# Patient Record
Sex: Female | Born: 1970 | Race: Black or African American | Hispanic: No | Marital: Married | Smoking: Never smoker
Health system: Southern US, Community
[De-identification: ages and names within clinical notes are randomized; demographics above are authoritative.]

## PROBLEM LIST (undated history)

## (undated) DIAGNOSIS — Z87898 Personal history of other specified conditions: Secondary | ICD-10-CM

## (undated) DIAGNOSIS — I340 Nonrheumatic mitral (valve) insufficiency: Secondary | ICD-10-CM

## (undated) DIAGNOSIS — J302 Other seasonal allergic rhinitis: Secondary | ICD-10-CM

## (undated) DIAGNOSIS — Z8249 Family history of ischemic heart disease and other diseases of the circulatory system: Secondary | ICD-10-CM

## (undated) DIAGNOSIS — R079 Chest pain, unspecified: Secondary | ICD-10-CM

## (undated) DIAGNOSIS — R011 Cardiac murmur, unspecified: Secondary | ICD-10-CM

## (undated) DIAGNOSIS — R42 Dizziness and giddiness: Secondary | ICD-10-CM

## (undated) DIAGNOSIS — T783XXA Angioneurotic edema, initial encounter: Secondary | ICD-10-CM

## (undated) DIAGNOSIS — J069 Acute upper respiratory infection, unspecified: Secondary | ICD-10-CM

## (undated) DIAGNOSIS — Z833 Family history of diabetes mellitus: Secondary | ICD-10-CM

## (undated) DIAGNOSIS — T8859XA Other complications of anesthesia, initial encounter: Secondary | ICD-10-CM

## (undated) DIAGNOSIS — J45909 Unspecified asthma, uncomplicated: Secondary | ICD-10-CM

## (undated) HISTORY — DX: Family history of ischemic heart disease and other diseases of the circulatory system: Z82.49

## (undated) HISTORY — DX: Other seasonal allergic rhinitis: J30.2

## (undated) HISTORY — DX: Dizziness and giddiness: R42

## (undated) HISTORY — DX: Chest pain, unspecified: R07.9

## (undated) HISTORY — PX: WISDOM TOOTH EXTRACTION: SHX21

## (undated) HISTORY — DX: Cardiac murmur, unspecified: R01.1

## (undated) HISTORY — DX: Acute upper respiratory infection, unspecified: J06.9

## (undated) HISTORY — DX: Unspecified asthma, uncomplicated: J45.909

## (undated) HISTORY — DX: Angioneurotic edema, initial encounter: T78.3XXA

## (undated) HISTORY — DX: Personal history of other specified conditions: Z87.898

## (undated) HISTORY — DX: Family history of diabetes mellitus: Z83.3

---

## 1999-05-23 HISTORY — PX: OTHER SURGICAL HISTORY: SHX169

## 2001-09-18 ENCOUNTER — Encounter: Payer: Self-pay | Admitting: Family Medicine

## 2001-09-18 ENCOUNTER — Ambulatory Visit (HOSPITAL_COMMUNITY): Admission: RE | Admit: 2001-09-18 | Discharge: 2001-09-18 | Payer: Self-pay | Admitting: Family Medicine

## 2001-10-15 ENCOUNTER — Encounter: Payer: Self-pay | Admitting: Family Medicine

## 2001-10-15 ENCOUNTER — Encounter (HOSPITAL_COMMUNITY): Admission: RE | Admit: 2001-10-15 | Discharge: 2001-11-14 | Payer: Self-pay | Admitting: Family Medicine

## 2002-02-06 ENCOUNTER — Ambulatory Visit (HOSPITAL_COMMUNITY): Admission: RE | Admit: 2002-02-06 | Discharge: 2002-02-06 | Payer: Self-pay | Admitting: Family Medicine

## 2002-02-06 ENCOUNTER — Encounter: Payer: Self-pay | Admitting: Family Medicine

## 2005-04-20 ENCOUNTER — Ambulatory Visit: Payer: Self-pay | Admitting: Family Medicine

## 2005-10-22 HISTORY — PX: TUBAL LIGATION: SHX77

## 2007-01-23 ENCOUNTER — Encounter: Payer: Self-pay | Admitting: Family Medicine

## 2009-05-31 ENCOUNTER — Ambulatory Visit (HOSPITAL_COMMUNITY): Admission: RE | Admit: 2009-05-31 | Discharge: 2009-05-31 | Payer: Self-pay | Admitting: Cardiology

## 2009-05-31 ENCOUNTER — Encounter (INDEPENDENT_AMBULATORY_CARE_PROVIDER_SITE_OTHER): Payer: Self-pay | Admitting: Cardiology

## 2009-05-31 HISTORY — PX: DOBUTAMINE STRESS ECHO: SHX5426

## 2010-02-21 NOTE — Letter (Signed)
Summary: rpc chart  rpc chart   Imported By: Curtis Sites 08/16/2009 11:48:57  _____________________________________________________________________  External Attachment:    Type:   Image     Comment:   External Document

## 2011-07-23 HISTORY — PX: TRANSTHORACIC ECHOCARDIOGRAM: SHX275

## 2013-04-12 ENCOUNTER — Encounter: Payer: Self-pay | Admitting: *Deleted

## 2013-04-14 ENCOUNTER — Ambulatory Visit (INDEPENDENT_AMBULATORY_CARE_PROVIDER_SITE_OTHER): Payer: Managed Care, Other (non HMO) | Admitting: Internal Medicine

## 2013-04-14 ENCOUNTER — Encounter: Payer: Self-pay | Admitting: Internal Medicine

## 2013-04-14 VITALS — BP 112/88 | HR 77 | Ht 63.0 in | Wt 205.6 lb

## 2013-04-14 DIAGNOSIS — R0789 Other chest pain: Secondary | ICD-10-CM

## 2013-04-14 DIAGNOSIS — R609 Edema, unspecified: Secondary | ICD-10-CM | POA: Insufficient documentation

## 2013-04-14 DIAGNOSIS — G629 Polyneuropathy, unspecified: Secondary | ICD-10-CM | POA: Insufficient documentation

## 2013-04-14 DIAGNOSIS — G589 Mononeuropathy, unspecified: Secondary | ICD-10-CM

## 2013-04-14 DIAGNOSIS — R600 Localized edema: Secondary | ICD-10-CM

## 2013-04-14 NOTE — Progress Notes (Signed)
OFFICE NOTE  Chief Complaint:  Arm tingling, swelling  Primary Care Physician: Maggie Font, MD  HPI:  Hailey Lam is a 43 year old female with history of chest pain in the past which was somewhat atypical and a low-risk stress echo. I saw her last in 2013, when she had some more mild shortness of breath, left-sided chest tingling. Her gynecologist thought she heard a heart murmur and she was subsequently referred for an echo. The echo does show mild mitral regurgitation and anterior prolapse of mitral leaflet suggesting mild mitral valve prolapse with a hyperdynamic EF at greater than 65%. Both of these could certainly be responsible for her murmur and the mitral valve prolapse would certainly fit her syndrome of anxiety as well as some palpitations. Her chest pain has been quite atypical and I felt did not require any further cardiac workup. Some of the symptoms may be coming from her neck and back, which she says when she is under stress the symptoms are worse, and she may want to seek out an orthopedic or spine surgeon.   Today she is reporting similar symptoms including twitching in the left upper chest and tingling down into the left arm. Chest is tingling and numbness in her fingers of both hands and some upper extremity swelling as well as lower extremity swelling. There does appear to be dependent edema as she does develop swelling in her legs which is worse as the day goes on and goes away in the morning.  She does report a history of sciatica mostly on the right and is seen a chiropractor about this but has never had any imaging of her lumbar cervical spine. She did have x-rays apparently of her right hip which showed some arthritis.  PMHx:  Past Medical History  Diagnosis Date  . Chest pain   . History of palpitations   . Seasonal allergies   . Dizziness   . Family history of hypertension   . Family history of diabetes mellitus     Past Surgical History  Procedure  Laterality Date  . Tubal ligation  10/2005  . Abdominal ablation  05/1999  . Dobutamine stress echo  05/31/2009    exercised to stage 3 of bruce protocol - normal BP response, no ischemia by EKG & echo criteria  . Transthoracic echocardiogram  07/2011    EF=>55%, LV hyperdynamic; MV leaflets appear thickened & mild MR; mild TR    FAMHx:  Family History  Problem Relation Age of Onset  . Pneumonia Maternal Grandmother     SOCHx:   reports that she has never smoked. She has never used smokeless tobacco. Her alcohol and drug histories are not on file.  ALLERGIES:  Allergies  Allergen Reactions  . Minocycline Swelling  . Sulfa Antibiotics     ROS: A comprehensive review of systems was negative except for: Cardiovascular: positive for lower extremity edema and and upper extremity edema Neurological: positive for neuropathy, low back and neck pain, parasthesias  HOME MEDS: Current Outpatient Prescriptions  Medication Sig Dispense Refill  . Cetirizine HCl (ZYRTEC PO) Take by mouth as needed.      . Chlorphen-Phenyltolox-PE-APAP (NOREL SR PO) Take by mouth as needed.       No current facility-administered medications for this visit.    LABS/IMAGING: No results found for this or any previous visit (from the past 48 hour(s)). No results found.  VITALS: BP 112/88  Pulse 77  Ht 5\' 3"  (1.6 m)  Wt 205  lb 9.6 oz (93.26 kg)  BMI 36.43 kg/m2  EXAM: General appearance: alert and no distress Neck: no carotid bruit and no JVD Lungs: clear to auscultation bilaterally Heart: regular rate and rhythm, S1, S2 normal and systolic murmur: early systolic 2/6, blowing at apex Abdomen: soft, non-tender; bowel sounds normal; no masses,  no organomegaly Extremities: edema 1+ pitting edema Pulses: 2+ and symmetric Skin: Skin color, texture, turgor normal. No rashes or lesions Neurologic: Mental status: Alert, oriented, thought content appropriate, normal sensation to light touch in the  extremities, +Phalen's and tinel's signs in the wrists Psych: Normal  EKG: Sinus rhythm at 77  ASSESSMENT: 1. Neuropathic pain, possibly from the neck and back 2. Upper and lower extremity edema possibly secondary to neuropathy or angioedema  PLAN: 1.   Hailey Lam is not describing any cardiac symptoms. Her upper extremity numbness and tingling is somewhat worsened in the fingers with positive Phalen's and Tinel's signs, suggesting this could be a carpal tunnel syndrome or perhaps a cervical radiculopathy. She has had problems with neck pain in the past and this could be radiating to her left upper chest. In addition she has problems with low back pain and has seen a chiropractor about right hip pain and sciatica. There are no lower extermity varicose veins or signs of venous insufficiency. Suspect her edema may be due to some vascular leak. I recommended salt avoidance and thought she would benefit from compression stockings. She could consider low-dose diuretic which she's taken in the past however she had problems with the urination related to that.  I strongly recommended that she be evaluated by an orthopedic spine surgeon or neurosurgeon, again, for further work up of her symptoms.  Pixie Casino, MD, [facility] Attending Cardiologist [facility]  Hailey Lam 04/14/2013, 12:47 PM

## 2013-04-14 NOTE — Patient Instructions (Signed)
Dr. Debara Pickett has ordered compression stocking. You can purchase them at a medical supply store.   Please follow up with Dr. Debara Pickett as needed.

## 2013-08-07 ENCOUNTER — Other Ambulatory Visit: Payer: Self-pay | Admitting: Obstetrics and Gynecology

## 2013-08-19 ENCOUNTER — Encounter: Payer: Self-pay | Admitting: Obstetrics and Gynecology

## 2013-08-19 ENCOUNTER — Other Ambulatory Visit (HOSPITAL_COMMUNITY)
Admission: RE | Admit: 2013-08-19 | Discharge: 2013-08-19 | Disposition: A | Discharge status: Home / Self Care | Payer: Managed Care, Other (non HMO) | Source: Ambulatory Visit | Attending: Obstetrics and Gynecology | Admitting: Obstetrics and Gynecology

## 2013-08-19 ENCOUNTER — Ambulatory Visit (INDEPENDENT_AMBULATORY_CARE_PROVIDER_SITE_OTHER): Payer: Managed Care, Other (non HMO) | Admitting: Obstetrics and Gynecology

## 2013-08-19 VITALS — BP 132/76 | Ht 63.5 in | Wt 208.0 lb

## 2013-08-19 DIAGNOSIS — Z01419 Encounter for gynecological examination (general) (routine) without abnormal findings: Secondary | ICD-10-CM

## 2013-08-19 DIAGNOSIS — Z1151 Encounter for screening for human papillomavirus (HPV): Secondary | ICD-10-CM | POA: Insufficient documentation

## 2013-08-19 NOTE — Addendum Note (Signed)
Addended by: Farley Ly on: 08/19/2013 03:09 PM   Modules accepted: Orders

## 2013-08-19 NOTE — Progress Notes (Signed)
This chart was scribed by Ludger Nutting, Medical Scribe, for Dr. Mallory Shirk on 08/19/13 at 2:47 PM. This chart was reviewed by Dr. Mallory Shirk for accuracy.    Assessment:  Annual Gyn Exam   Plan:  1. pap smear done, next pap due 1 year 2. return annually or prn 3    Annual mammogram advised Subjective:  Hailey Lam is a 43 y.o. female No obstetric history on file. who presents for annual exam. Patient's last menstrual period was 08/07/2013. The patient is status post endometrial ablation with light but erratic bleeding for up to 1 week. She reports a mucus-like discharge with her periods for the past 6 months to a year. Patient states the mucus is heavier during the first few days of her period.    The following portions of the patient's history were reviewed and updated as appropriate: allergies, current medications, past family history, past medical history, past social history, past surgical history and problem list. Past Medical History  Diagnosis Date  . Chest pain   . History of palpitations   . Seasonal allergies   . Dizziness   . Family history of hypertension   . Family history of diabetes mellitus   . Heart murmur     Past Surgical History  Procedure Laterality Date  . Tubal ligation  10/2005  . Abdominal ablation  05/1999  . Dobutamine stress echo  05/31/2009    exercised to stage 3 of bruce protocol - normal BP response, no ischemia by EKG & echo criteria  . Transthoracic echocardiogram  07/2011    EF=>55%, LV hyperdynamic; MV leaflets appear thickened & mild MR; mild TR    Current outpatient prescriptions:BEE POLLEN PO, Take 2 tablets by mouth daily., Disp: , Rfl: ;  Cetirizine HCl (ZYRTEC PO), Take by mouth as needed., Disp: , Rfl: ;  Chlorphen-Phenyltolox-PE-APAP (NOREL SR PO), Take by mouth as needed., Disp: , Rfl:   Review of Systems Constitutional: negative Gastrointestinal: negative Genitourinary: vaginal spotting, vaginal discharge, abdominal cramping with  periods   Objective:  BP 132/76  Ht 5' 3.5" (1.613 m)  Wt 208 lb (94.348 kg)  BMI 36.26 kg/m2  LMP 08/07/2013   BMI: Body mass index is 36.26 kg/(m^2).  General Appearance: Alert, appropriate appearance for age. No acute distress HEENT: Grossly normal Neck / Thyroid:  Cardiovascular: RRR; normal S1, S2, no murmur Lungs: CTA bilaterally Back: No CVAT Breast Exam: No dimpling, nipple retraction or discharge. No masses or nodes., Normal to inspection, Normal breast tissue bilaterally and No masses or nodes.No dimpling, nipple retraction or discharge. Gastrointestinal: Soft, non-tender, no masses or organomegaly Pelvic Exam: External genitalia: normal general appearance Vaginal: normal mucosa without prolapse or lesions and normal without tenderness, induration or masses Cervix: normal appearance Adnexa: normal bimanual exam Uterus: single, nontender, retroverted and enlarged to 10 week size Rectovaginal: not indicated Lymphatic Exam: Non-palpable nodes in neck, clavicular, axillary, or inguinal regions  Skin: no rash or abnormalities Neurologic: Normal gait and speech, no tremor  Psychiatric: Alert and oriented, appropriate affect.  Urinalysis:Not done  Mallory Shirk. MD Pgr [phone] 2:47 PM

## 2013-08-19 NOTE — Patient Instructions (Signed)
Fibroids Fibroids are lumps (tumors) that can occur any place in a woman's body. These lumps are not cancerous. Fibroids vary in size, weight, and where they grow. HOME CARE  Do not take aspirin.  Write down the number of pads or tampons you use during your period. Tell your doctor. This can help determine the best treatment for you. GET HELP RIGHT AWAY IF:  You have pain in your lower belly (abdomen) that is not helped with medicine.  You have cramps that are not helped with medicine.  You have more bleeding between or during your period.  You feel lightheaded or pass out (faint).  Your lower belly pain gets worse. MAKE SURE YOU:  Understand these instructions.  Will watch your condition.  Will get help right away if you are not doing well or get worse. Document Released: 02/10/2010 Document Revised: 04/02/2011 Document Reviewed: 02/10/2010 [employer] Patient Information 2015 [employer]. This information is not intended to replace advice given to you by your health care provider. Make sure you discuss any questions you have with your health care provider.  

## 2013-08-24 LAB — CYTOLOGY - PAP

## 2014-12-13 ENCOUNTER — Other Ambulatory Visit: Payer: Managed Care, Other (non HMO) | Admitting: Obstetrics and Gynecology

## 2014-12-23 ENCOUNTER — Ambulatory Visit (INDEPENDENT_AMBULATORY_CARE_PROVIDER_SITE_OTHER): Payer: Managed Care, Other (non HMO) | Admitting: Obstetrics and Gynecology

## 2014-12-23 ENCOUNTER — Encounter: Payer: Self-pay | Admitting: Obstetrics and Gynecology

## 2014-12-23 VITALS — BP 136/80 | Ht 63.5 in | Wt 203.0 lb

## 2014-12-23 DIAGNOSIS — Z01419 Encounter for gynecological examination (general) (routine) without abnormal findings: Secondary | ICD-10-CM | POA: Diagnosis not present

## 2014-12-23 NOTE — Progress Notes (Signed)
Patient ID: Hailey Lam, female   DOB: February 16, 1970, 43 y.o.   MRN: [id number]  Assessment:  1.Annual Gyn Exam 2. Stable uterine enlargement 8-10 wks with uterine retroversion 3. Heavy and prolonged menstrual periods, discussed birth control options  Plan:  1. pap smear not done, normal pap done 1 year ago; will repeat in 2 years 2. return annually or prn 3    Annual mammogram advised 4  If pt wants to consider hyst, make appt after jan 1. Subjective:  Hailey Lam is a 44 y.o. female No obstetric history on file. who presents for annual exam. Patient's last menstrual period was 12/10/2014. The patient has complaints today of irregular menstrual periods. She states the periods have also become longer than normal and are now typically 1 week. She also reports her menstrual periods are heavier and consist of blots. Pt endorses associated menstrual HAs and fatigue during her periods. She has previously had an endometrial ablation. Pt also notes she feels like her uterus has dropped; last physical 1 year ago who uterus had increased to 10 week size.  The following portions of the patient's history were reviewed and updated as appropriate: allergies, current medications, past family history, past medical history, past social history, past surgical history and problem list. Past Medical History  Diagnosis Date  . Chest pain   . History of palpitations   . Seasonal allergies   . Dizziness   . Family history of hypertension   . Family history of diabetes mellitus   . Heart murmur     Past Surgical History  Procedure Laterality Date  . Tubal ligation  10/2005  . Abdominal ablation  05/1999  . Dobutamine stress echo  05/31/2009    exercised to stage 3 of bruce protocol - normal BP response, no ischemia by EKG & echo criteria  . Transthoracic echocardiogram  07/2011    EF=>55%, LV hyperdynamic; MV leaflets appear thickened & mild MR; mild TR     Current outpatient prescriptions:  .  Cetirizine  HCl (ZYRTEC PO), Take by mouth as needed., Disp: , Rfl:  .  Chlorphen-Phenyltolox-PE-APAP (NOREL SR PO), Take by mouth as needed., Disp: , Rfl:   Review of Systems Constitutional: negative Gastrointestinal: negative Genitourinary: positive for irregular and heavy menstrual periods  Objective:  BP 136/80 mmHg  Ht 5' 3.5" (1.613 m)  Wt 203 lb (92.08 kg)  BMI 35.39 kg/m2  LMP 12/10/2014   BMI: Body mass index is 35.39 kg/(m^2).  General Appearance: Alert, appropriate appearance for age. No acute distress Breast Exam: No masses or nodes.No dimpling, nipple retraction or discharge. Gastrointestinal: Soft, non-tender, no masses or organomegaly Pelvic Exam: good vaginal length and good support; normal cervix in appearance; good secretions; uterus is retroverted, 8-10 weeks size. Rectovaginal: not indicated and guaiac negative stool obtained Lymphatic Exam: Non-palpable nodes in neck, clavicular, axillary, or inguinal regions  Skin: no rash or abnormalities Neurologic: Normal gait and speech, no tremor  Psychiatric: Alert and oriented, appropriate affect.  Urinalysis:Not done  Mallory Shirk. MD Pgr [phone] 12:05 PM  By signing my name below, I, Erling Conte, attest that this documentation has been prepared under the direction and in the presence of Jonnie Kind, MD. Electronically Signed: Erling Conte, ED Scribe. 12/23/2014. 12:15 PM.  I personally performed the services described in this documentation, which was SCRIBED in my presence. The recorded information has been reviewed and considered accurate. It has been edited as necessary during review. Jonnie Kind, MD

## 2014-12-23 NOTE — Progress Notes (Signed)
Patient ID: Hailey Lam, female   DOB: 05/05/1970, 44 y.o.   MRN: [id number] Pt here today for annual exam. Pt states that sh efeels like her uterus has dropped. Pt wants to discuss menopausal symptoms.

## 2015-08-15 ENCOUNTER — Other Ambulatory Visit: Payer: Self-pay | Admitting: Orthopedic Surgery

## 2015-08-15 ENCOUNTER — Other Ambulatory Visit: Payer: Self-pay | Admitting: Obstetrics and Gynecology

## 2015-08-15 DIAGNOSIS — Z1231 Encounter for screening mammogram for malignant neoplasm of breast: Secondary | ICD-10-CM

## 2015-08-22 ENCOUNTER — Ambulatory Visit (HOSPITAL_COMMUNITY)
Admission: RE | Admit: 2015-08-22 | Discharge: 2015-08-22 | Disposition: A | Discharge status: Home / Self Care | Payer: 59 | Source: Ambulatory Visit | Attending: Obstetrics and Gynecology | Admitting: Obstetrics and Gynecology

## 2015-08-22 DIAGNOSIS — Z1231 Encounter for screening mammogram for malignant neoplasm of breast: Secondary | ICD-10-CM | POA: Insufficient documentation

## 2016-01-02 ENCOUNTER — Encounter: Payer: Self-pay | Admitting: Obstetrics and Gynecology

## 2016-01-02 ENCOUNTER — Other Ambulatory Visit (HOSPITAL_COMMUNITY)
Admission: RE | Admit: 2016-01-02 | Discharge: 2016-01-02 | Disposition: A | Discharge status: Home / Self Care | Payer: 59 | Source: Ambulatory Visit | Attending: Obstetrics and Gynecology | Admitting: Obstetrics and Gynecology

## 2016-01-02 ENCOUNTER — Ambulatory Visit (INDEPENDENT_AMBULATORY_CARE_PROVIDER_SITE_OTHER): Payer: 59 | Admitting: Obstetrics and Gynecology

## 2016-01-02 VITALS — BP 100/66 | HR 108 | Ht 63.0 in | Wt 204.2 lb

## 2016-01-02 DIAGNOSIS — Z1151 Encounter for screening for human papillomavirus (HPV): Secondary | ICD-10-CM | POA: Insufficient documentation

## 2016-01-02 DIAGNOSIS — Z01419 Encounter for gynecological examination (general) (routine) without abnormal findings: Secondary | ICD-10-CM

## 2016-01-02 DIAGNOSIS — Z1212 Encounter for screening for malignant neoplasm of rectum: Secondary | ICD-10-CM | POA: Diagnosis not present

## 2016-01-02 LAB — HEMOCCULT GUIAC POC 1CARD (OFFICE): FECAL OCCULT BLD: NEGATIVE

## 2016-01-02 NOTE — Progress Notes (Signed)
Patient ID: Hailey Lam, female   DOB: 1970/02/09, 45 y.o.   MRN: [id number]  Assessment:  Annual Gyn Exam   Plan:  1. pap smear done, next pap due in 3 years  2. return annually or prn 3    Annual mammogram and regular self exams advised  Subjective:   Chief Complaint  Patient presents with  . Gynecologic Exam     Hailey Lam is a 45 y.o. female No obstetric history on file. who presents for annual exam. Patient's last menstrual period was 12/09/2015.   The patient has complaints today of occasional uterine cramping. She also notes she has noticed fluid retention. Per pt, she does water exercises 3x a week x 1hr and works out on the elliptical 2x a week x30 min. She also states she has been following a completely plant based diet for 3 months.   Discussion: 1. Discussed with pt benefits of weight-bearing exercise in addition to her exercises to further weight loss efforts. Educated pt on the physiology on weight loss and the metabolism of fat.   At end of discussion, pt had opportunity to ask questions and has no further questions at this time.   Specific discussion of additional weight loss methods as noted above. Greater than 50% was spent in counseling and coordination of care with the patient.   Total time greater than: 25 minutes.    The following portions of the patient's history were reviewed and updated as appropriate: allergies, current medications, past family history, past medical history, past social history, past surgical history and problem list. Past Medical History:  Diagnosis Date  . Chest pain   . Dizziness   . Family history of diabetes mellitus   . Family history of hypertension   . Heart murmur   . History of palpitations   . Seasonal allergies     Past Surgical History:  Procedure Laterality Date  . Abdominal Ablation  05/1999  . DOBUTAMINE STRESS ECHO  05/31/2009   exercised to stage 3 of bruce protocol - normal BP response, no ischemia by EKG & echo  criteria  . TRANSTHORACIC ECHOCARDIOGRAM  07/2011   EF=>55%, LV hyperdynamic; MV leaflets appear thickened & mild MR; mild TR  . TUBAL LIGATION  10/2005     Current Outpatient Prescriptions:  .  Cetirizine HCl (ZYRTEC PO), Take by mouth as needed., Disp: , Rfl:  .  Chlorphen-Phenyltolox-PE-APAP (NOREL SR PO), Take by mouth as needed., Disp: , Rfl:   Review of Systems Constitutional: fluid retention  Gastrointestinal: negative Genitourinary: occasional uterine cramping    Objective:  BP 100/66   Pulse (!) 108   Ht 5\' 3"  (1.6 m)   Wt 204 lb 3.2 oz (92.6 kg)   LMP 12/09/2015   BMI 36.17 kg/m    BMI: Body mass index is 36.17 kg/m.  General Appearance: Alert, appropriate appearance for age. No acute distress HEENT: Grossly normal Neck / Thyroid:  Cardiovascular: RRR; normal S1, S2, no murmur Lungs: CTA bilaterally Back: No CVAT Breast Exam: No masses or nodes.No dimpling, nipple retraction or discharge. Fibrous cystic tissue on the right breast with tenderness.  Gastrointestinal: Soft, non-tender, no masses or organomegaly Pelvic Exam:  External genitalia: normal general appearance Vaginal: normal mucosa without prolapse or lesions Cervix: normal appearance, nabothian cyst at 4 o'clock to the anterior lip about 1 cm in size.  Adnexa: normal bimanual exam Uterus: normal single, nontender Rectovaginal: normal rectal, no masses and guaiac negative stool obtained Lymphatic Exam: Non-palpable nodes  in neck, clavicular, axillary, or inguinal regions  Skin: no rash or abnormalities Neurologic: Normal gait and speech, no tremor  Psychiatric: Alert and oriented, appropriate affect.  Urinalysis:Not done  Guaiac negative   Mallory Shirk. MD Pgr [phone] 9:58 AM   By signing my name below, I, Hansel Feinstein, attest that this documentation has been prepared under the direction and in the presence of Jonnie Kind, MD. Electronically Signed: Hansel Feinstein, ED Scribe. 01/02/16.  9:58 AM.  I personally performed the services described in this documentation, which was SCRIBED in my presence. The recorded information has been reviewed and considered accurate. It has been edited as necessary during review. Jonnie Kind, MD

## 2016-01-03 LAB — CYTOLOGY - PAP
Diagnosis: NEGATIVE
HPV (WINDOPATH): NOT DETECTED

## 2017-04-25 ENCOUNTER — Ambulatory Visit: Payer: 59 | Admitting: Allergy & Immunology

## 2017-04-25 ENCOUNTER — Encounter: Payer: Self-pay | Admitting: Allergy & Immunology

## 2017-04-25 VITALS — BP 136/84 | HR 76 | Temp 98.4°F | Resp 18 | Ht 63.5 in | Wt 208.2 lb

## 2017-04-25 DIAGNOSIS — J4530 Mild persistent asthma, uncomplicated: Secondary | ICD-10-CM | POA: Insufficient documentation

## 2017-04-25 DIAGNOSIS — L239 Allergic contact dermatitis, unspecified cause: Secondary | ICD-10-CM

## 2017-04-25 DIAGNOSIS — J3089 Other allergic rhinitis: Secondary | ICD-10-CM

## 2017-04-25 DIAGNOSIS — J302 Other seasonal allergic rhinitis: Secondary | ICD-10-CM | POA: Diagnosis not present

## 2017-04-25 DIAGNOSIS — T781XXD Other adverse food reactions, not elsewhere classified, subsequent encounter: Secondary | ICD-10-CM

## 2017-04-25 DIAGNOSIS — L232 Allergic contact dermatitis due to cosmetics: Secondary | ICD-10-CM | POA: Insufficient documentation

## 2017-04-25 DIAGNOSIS — T781XXA Other adverse food reactions, not elsewhere classified, initial encounter: Secondary | ICD-10-CM | POA: Insufficient documentation

## 2017-04-25 MED ORDER — BECLOMETHASONE DIPROP HFA 80 MCG/ACT IN AERB: 2.0000 | INHALATION_SPRAY | Freq: Two times a day (BID) | RESPIRATORY_TRACT | 5 refills | Status: DC

## 2017-04-25 MED ORDER — AZELASTINE HCL 0.15 % NA SOLN: 1.0000 | Freq: Two times a day (BID) | NASAL | 5 refills | Status: DC

## 2017-04-25 NOTE — Progress Notes (Signed)
NEW PATIENT  Date of Service/Encounter:  04/25/17  Referring provider: Lucia Gaskins, MD   Assessment:   Mild persistent asthma, uncomplicated  Seasonal and perennial allergic rhinitis (trees, weeds, grasses, dust mites and cockroach)  Allergic contact dermatitis - unknown trigger  Adverse food reaction (strawberries, shrimp, egg, soy) - with negative testing and a rather low risk vague history  Plan/Recommendations:   1. Seasonal and perennial allergic rhinitis - Testing today showed: trees, weeds, grasses, dust mites and cockroach - Avoidance measures provided. - Continue with: Zyrtec (cetirizine) 10mg  tablet once daily - Start taking: Nasacort (triamcinolone) one spray per nostril daily and Astelin (azelastine) 2 sprays per nostril 1-2 times daily as needed - You can use an extra dose of the antihistamine, if needed, for breakthrough symptoms.  - Consider nasal saline rinses 1-2 times daily to remove allergens from the nasal cavities as well as help with mucous clearance (this is especially helpful to do before the nasal sprays are given) - Consider allergy shots as a means of long-term control. - Allergy shots "re-train" and "reset" the immune system to ignore environmental allergens and decrease the resulting immune response to those allergens (sneezing, itchy watery eyes, runny nose, nasal congestion, etc).    - Allergy shots improve symptoms in 75-85% of patients.  - We can discuss more at the next appointment if the medications are not working for you.  2. Mild persistent asthma, uncomplicated - Lung function looked terrible, but it did improve nicely with the albuterol nebulizer treatment. - I think that you would benefit from a a daily inhaled steroid: Qvar 35mcg - Daily controller medication(s): Singulair 10mg  daily and Qvar 69mcg Redihaler 2 puffs twice daily - Prior to physical activity: ProAir 2 puffs 10-15 minutes before physical activity. - Rescue  medications: ProAir 4 puffs every 4-6 hours as needed - Changes during respiratory infections or worsening symptoms: Increase Qvar 36mcg to 4 puffs twice daily for TWO WEEKS. - Asthma control goals:  * Full participation in all desired activities (may need albuterol before activity) * Albuterol use two time or less a week on average (not counting use with activity) * Cough interfering with sleep two time or less a month * Oral steroids no more than once a year * No hospitalizations  3. Allergic contact dermatitis - Contact dermatitis is a rash that is caused by a different immune cell (T cells). - Testing for this kind of reaction requires patch testing to a panel of chemicals (True Test). - These are placed on a Monday and then read on a Wednesday and a Friday. - You can schedule this on your way out today.  4. Adverse food reaction (strawberries, eggs, shrimp, and soy) - Testing was negative to strawberries, eggs, shrimp, and soy. - There is a the low positive predictive value of food allergy testing and hence the high possibility of false positives. - In contrast, food allergy testing has a high negative predictive value, therefore if testing is negative we can be relatively assured that they are indeed negative.  - The history of reaction with strawberries, egg, and soy are rather vague, therefore I do not think that we need blood testing for these for confirmation. - However, the shrimp reaction is more severe, therefore we will get blood testing for confirm this.   5. Return in about 3 months (around 07/25/2017).  Subjective:   Alexcia Schools is a 47 y.o. female presenting today for evaluation of  Chief Complaint  Patient presents  with  . Allergies    suspected food allergies, allergies acting up  . Asthma    Having asthma attacks every other day  . Cough    Terrace Zabawa has a history of the following: Patient Active Problem List   Diagnosis Date Noted  . Encounter for routine  gynecological examination 08/19/2013  . Edema 04/14/2013  . Neuropathy 04/14/2013  . Atypical chest pain 04/14/2013    History obtained from: chart review and patient.  [name] was referred by Lucia Gaskins, MD.     Shary is a 47 y.o. female presenting for an evaluation of environmental and food allergies. Her history is rather complicated.    Allergic reaction: She had an episode in January 2019 whenshe reacted to tea tree oil. She uses it in her hair on a fairly routine basis. Her reaction involved her face. She reports that she had eye swelling. This was the morning after using the tea tree oil. She does have a variety of other problems with fragrances and shampoos. She is unable to use fragrances, perfumes, or body washes.   Asthma/Respiratory Symptom History: She currently has albuterol, which she has been using more recently over the last four months. She is now using it around 1-2 times per day with improvement in her symptoms. She never had to use a regular daily controller medication for her asthma.   Allergic Rhinitis Symptom History: She reports that she has symptoms when she goes outside. She has problems with itching with exposure to the pollens. She wipes her face with the baby wipes. She reports weps on her face. Normally, she uses cetirizine but she has not take it this week. She was tested 20+ years ago at [facility], but is unsure what was positive at that time.   Food Allergy Symptom History: She also reports that she is allergic to shrimp, but she tolerates other shellfish without a problem. She reports that she gets hand burning when she peels raw shrimp. She has not tried eating it since she developed this reaction. She does tolerate all of the other shellfish without a problem.   Otherwise, there is no history of other atopic diseases, including drug allergies, stinging insect allergies, or urticaria. There is no significant infectious history. Vaccinations are up to  date.    Past Medical History: Patient Active Problem List   Diagnosis Date Noted  . Encounter for routine gynecological examination 08/19/2013  . Edema 04/14/2013  . Neuropathy 04/14/2013  . Atypical chest pain 04/14/2013    Medication List:  Allergies as of 04/25/2017      Reactions   Minocycline Swelling   Sulfa Antibiotics    Tea Tree Oil Swelling   Reaction to to tea tree oil, Facial swelling      Medication List        Accurate as of 04/25/17  5:49 PM. Always use your most recent med list.          albuterol 108 (90 Base) MCG/ACT inhaler Commonly known as:  PROVENTIL HFA;VENTOLIN HFA   Azelastine HCl 0.15 % Soln Place 1-2 sprays into both nostrils 2 (two) times daily.   beclomethasone 80 MCG/ACT inhaler Commonly known as:  QVAR REDIHALER Inhale 2 puffs into the lungs 2 (two) times daily.   NOREL SR PO Take by mouth as needed.   ZYRTEC PO Take by mouth as needed.       Birth History: non-contributory.   Developmental History: non-contributory.   Past Surgical History: Past Surgical  History:  Procedure Laterality Date  . Abdominal Ablation  05/1999  . DOBUTAMINE STRESS ECHO  05/31/2009   exercised to stage 3 of bruce protocol - normal BP response, no ischemia by EKG & echo criteria  . TRANSTHORACIC ECHOCARDIOGRAM  07/2011   EF=>55%, LV hyperdynamic; MV leaflets appear thickened & mild MR; mild TR  . TUBAL LIGATION  10/2005     Family History: Family History  Problem Relation Age of Onset  . Pneumonia Maternal Grandmother   . Asthma Mother   . Diabetes Mother   . Hypertension Mother   . Allergic rhinitis Mother        Allergic to "soy milk"  . Diabetes Father   . Hypertension Father   . Eczema Son   . Psoriasis Son   . Heart disease Maternal Uncle   . Heart attack Maternal Uncle        3 heart attacks  . Eczema Son   . Asthma Son   . Eczema Son      Social History: Amal lives at home with her family. They live in a 22+ year old home  with carpeting throughout the home. They have central heating and cooling. There are no animals inside or outside of the home. There are dust mite coverings on the bedding, but not the pillows. There is no tobacco exposure in the home.      Review of Systems: a 14-point review of systems is pertinent for what is mentioned in HPI.  Otherwise, all other systems were negative. Constitutional: negative other than that listed in the HPI Eyes: negative other than that listed in the HPI Ears, nose, mouth, throat, and face: negative other than that listed in the HPI Respiratory: negative other than that listed in the HPI Cardiovascular: negative other than that listed in the HPI Gastrointestinal: negative other than that listed in the HPI Genitourinary: negative other than that listed in the HPI Integument: negative other than that listed in the HPI Hematologic: negative other than that listed in the HPI Musculoskeletal: negative other than that listed in the HPI Neurological: negative other than that listed in the HPI Allergy/Immunologic: negative other than that listed in the HPI    Objective:   Blood pressure 136/84, pulse 76, temperature 98.4 F (36.9 C), resp. rate 18, height 5' 3.5" (1.613 m), weight 208 lb 3.2 oz (94.4 kg), SpO2 99 %. Body mass index is 36.3 kg/m.   Physical Exam:  General: Alert, interactive, in no acute distress. Pleasant talkative female.  Eyes: No conjunctival injection bilaterally, no discharge on the right, no discharge on the left and no Horner-Trantas dots present. PERRL bilaterally. EOMI without pain. No photophobia.  Ears: Right TM pearly gray with normal light reflex, Left TM pearly gray with normal light reflex, Right TM intact without perforation and Left TM intact without perforation.  Nose/Throat: External nose within normal limits and septum midline. Turbinates edematous with clear discharge. Posterior oropharynx mildly erythematous without  cobblestoning in the posterior oropharynx. Tonsils 2+ without exudates.  Tongue without thrush. Neck: Supple without thyromegaly. Trachea midline. Adenopathy: no enlarged lymph nodes appreciated in the anterior cervical, occipital, axillary, epitrochlear, inguinal, or popliteal regions. Lungs: Clear to auscultation without wheezing, rhonchi or rales. No increased work of breathing. CV: Normal S1/S2. No murmurs. Capillary refill <2 seconds.  Abdomen: Nondistended, nontender. No guarding or rebound tenderness. Bowel sounds present in all fields and hypoactive  Skin: Warm and dry, without lesions or rashes. Extremities:  No clubbing, cyanosis  or edema. Neuro:   Grossly intact. No focal deficits appreciated. Responsive to questions.  Diagnostic studies:   Spirometry: results abnormal (FEV1: 1.81/63%, FVC: 2.29/64%, FEV1/FVC: 79%).    Spirometry consistent with possible restrictive disease. Albuterol/Atrovent nebulizer treatment given in clinic with significant improvement in FEV1 and FVC per ATS criteria. Overall the spirometry essentially normalized.   Allergy Studies:   Indoor/Outdoor Percutaneous Adult Environmental Panel: positive to bahia grass, [place] grass, johnson grass, [place] blue grass, meadow fescue grass, perennial rye grass, sweet vernal grass, timothy grass, cocklebur, red cedar, oak, Df mite and Dp mites. Otherwise negative with adequate controls.  Indoor/Outdoor Selected Intradermal Environmental Panel: positive to ragweed mix, weed mix and cockroach. Otherwise negative with adequate controls.  Selected Food Panel: negative to Soy, Egg, Shrimp and Strawberry    Allergy testing results were read and interpreted by myself, documented by clinical staff.     Salvatore Marvel, MD [facility]

## 2017-04-25 NOTE — Patient Instructions (Addendum)
1. Seasonal and perennial allergic rhinitis - Testing today showed: trees, weeds, grasses, dust mites and cockroach - Avoidance measures provided. - Continue with: Zyrtec (cetirizine) 10mg  tablet once daily - Start taking: Nasacort (triamcinolone) one spray per nostril daily and Astelin (azelastine) 2 sprays per nostril 1-2 times daily as needed - You can use an extra dose of the antihistamine, if needed, for breakthrough symptoms.  - Consider nasal saline rinses 1-2 times daily to remove allergens from the nasal cavities as well as help with mucous clearance (this is especially helpful to do before the nasal sprays are given) - Consider allergy shots as a means of long-term control. - Allergy shots "re-train" and "reset" the immune system to ignore environmental allergens and decrease the resulting immune response to those allergens (sneezing, itchy watery eyes, runny nose, nasal congestion, etc).    - Allergy shots improve symptoms in 75-85% of patients.  - We can discuss more at the next appointment if the medications are not working for you.  2. Mild persistent asthma, uncomplicated - Lung function looked terrible, but it did improve nicely with the albuterol nebulizer treatment. - I think that you would benefit from a a daily inhaled steroid: Qvar 36mcg - Daily controller medication(s): Singulair 10mg  daily and Qvar 86mcg Redihaler 2 puffs twice daily - Prior to physical activity: ProAir 2 puffs 10-15 minutes before physical activity. - Rescue medications: ProAir 4 puffs every 4-6 hours as needed - Changes during respiratory infections or worsening symptoms: Increase Qvar 9mcg to 4 puffs twice daily for TWO WEEKS. - Asthma control goals:  * Full participation in all desired activities (may need albuterol before activity) * Albuterol use two time or less a week on average (not counting use with activity) * Cough interfering with sleep two time or less a month * Oral steroids no more than  once a year * No hospitalizations  3. Allergic contact dermatitis - Contact dermatitis is a rash that is caused by a different immune cell (T cells). - Testing for this kind of reaction requires patch testing to a panel of chemicals (True Test). - These are placed on a Monday and then read on a Wednesday and a Friday. - You can schedule this on your way out today.  4. Return in about 3 months (around 07/25/2017).   Please inform us of any Emergency Department visits, hospitalizations, or changes in symptoms. Call us before going to the ED for breathing or allergy symptoms since we might be able to fit you in for a sick visit. Feel free to contact us anytime with any questions, problems, or concerns.  It was a pleasure to meet you today!  Websites that have reliable patient information: 1. American Academy of Asthma, Allergy, and Immunology: www.aaaai.org 2. Food Allergy Research and Education (FARE): foodallergy.org 3. Mothers of Asthmatics: http://www.asthmacommunitynetwork.org 4. American College of Allergy, Asthma, and Immunology: www.acaai.org   Reducing Pollen Exposure  The American Academy of Allergy, Asthma and Immunology suggests the following steps to reduce your exposure to pollen during allergy seasons.    1. Do not hang sheets or clothing out to dry; pollen may collect on these items. 2. Do not mow lawns or spend time around freshly cut grass; mowing stirs up pollen. 3. Keep windows closed at night.  Keep car windows closed while driving. 4. Minimize morning activities outdoors, a time when pollen counts are usually at their highest. 5. Stay indoors as much as possible when pollen counts or humidity is high  and on windy days when pollen tends to remain in the air longer. 6. Use air conditioning when possible.  Many air conditioners have filters that trap the pollen spores. 7. Use a HEPA room air filter to remove pollen form the indoor air you breathe.  Control of House  Dust Mite Allergen    House dust mites play a major role in allergic asthma and rhinitis.  They occur in environments with high humidity wherever human skin, the food for dust mites is found. High levels have been detected in dust obtained from mattresses, pillows, carpets, upholstered furniture, bed covers, clothes and soft toys.  The principal allergen of the house dust mite is found in its feces.  A gram of dust may contain 1,000 mites and 250,000 fecal particles.  Mite antigen is easily measured in the air during house cleaning activities.    1. Encase mattresses, including the box spring, and pillow, in an air tight cover.  Seal the zipper end of the encased mattresses with wide adhesive tape. 2. Wash the bedding in water of 130 degrees Farenheit weekly.  Avoid cotton comforters/quilts and flannel bedding: the most ideal bed covering is the dacron comforter. 3. Remove all upholstered furniture from the bedroom. 4. Remove carpets, carpet padding, rugs, and non-washable window drapes from the bedroom.  Wash drapes weekly or use plastic window coverings. 5. Remove all non-washable stuffed toys from the bedroom.  Wash stuffed toys weekly. 6. Have the room cleaned frequently with a vacuum cleaner and a damp dust-mop.  The patient should not be in a room which is being cleaned and should wait 1 hour after cleaning before going into the room. 7. Close and seal all heating outlets in the bedroom.  Otherwise, the room will become filled with dust-laden air.  An electric heater can be used to heat the room. 8. Reduce indoor humidity to less than 50%.  Do not use a humidifier.

## 2017-04-26 ENCOUNTER — Encounter: Payer: Self-pay | Admitting: Allergy & Immunology

## 2017-04-26 MED ORDER — MONTELUKAST SODIUM 10 MG PO TABS: 10.0000 mg | ORAL_TABLET | Freq: Every day | ORAL | 5 refills | Status: DC

## 2017-05-13 ENCOUNTER — Encounter: Payer: Self-pay | Admitting: Allergy & Immunology

## 2017-05-13 ENCOUNTER — Ambulatory Visit: Payer: 59 | Admitting: Allergy & Immunology

## 2017-05-13 VITALS — BP 136/84 | HR 79 | Temp 98.2°F | Resp 16 | Ht 62.0 in | Wt 207.8 lb

## 2017-05-13 DIAGNOSIS — L239 Allergic contact dermatitis, unspecified cause: Secondary | ICD-10-CM

## 2017-05-13 DIAGNOSIS — J3089 Other allergic rhinitis: Secondary | ICD-10-CM | POA: Diagnosis not present

## 2017-05-13 DIAGNOSIS — T7800XD Anaphylactic reaction due to unspecified food, subsequent encounter: Secondary | ICD-10-CM | POA: Diagnosis not present

## 2017-05-13 DIAGNOSIS — J4530 Mild persistent asthma, uncomplicated: Secondary | ICD-10-CM | POA: Diagnosis not present

## 2017-05-13 DIAGNOSIS — J302 Other seasonal allergic rhinitis: Secondary | ICD-10-CM | POA: Diagnosis not present

## 2017-05-13 NOTE — Patient Instructions (Addendum)
1. Seasonal and perennial allergic rhinitis (trees, weeds, grasses, dust mites and cockroach) - Your nose was full of clear mucous and your left ear had clear fluid behind it. - Continue with all of your nose sprays and added on Afrin twice daily for a few days to help with turbinate enlargement.  - Continue with: Zyrtec (cetirizine) 10mg  tablet once daily, Nasacort (triamcinolone) one spray per nostril daily and Astelin (azelastine) 2 sprays per nostril 1-2 times daily as needed - You can use an extra dose of the antihistamine, if needed, for breakthrough symptoms.  - Consider nasal saline rinses 1-2 times daily to remove allergens from the nasal cavities as well as help with mucous clearance (this is especially helpful to do before the nasal sprays are given) - Consider allergy shots as a means of long-term control. - Allergy shots "re-train" and "reset" the immune system to ignore environmental allergens and decrease the resulting immune response to those allergens (sneezing, itchy watery eyes, runny nose, nasal congestion, etc).    - Allergy shots improve symptoms in 75-85% of patients.  - We can discuss more at the next appointment if the medications are not working for you.  2. Mild persistent asthma, uncomplicated - Lung function looked much better today. - Continue with the current medication regimen.  - Daily controller medication(s): Singulair 10mg  daily and Qvar 23mcg Redihaler 2 puffs twice daily - Prior to physical activity: ProAir 2 puffs 10-15 minutes before physical activity. - Rescue medications: ProAir 4 puffs every 4-6 hours as needed - Changes during respiratory infections or worsening symptoms: Increase Qvar 74mcg to 4 puffs twice daily for TWO WEEKS. - Asthma control goals:  * Full participation in all desired activities (may need albuterol before activity) * Albuterol use two time or less a week on average (not counting use with activity) * Cough interfering with sleep two  time or less a month * Oral steroids no more than once a year * No hospitalizations  3. Allergic contact dermatitis - We placed True Test patches today. - We also placed patch testing for the tea tree oil, black seed oil, and the body wash. - Come back on Wednesday and Friday so that we can see how your body reacts to these.   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  - We will call you in 1-2 weeks with the results. - There is a common protein (tropomysin) between dust mites and shrimp, which might explain some of your symptoms.   5. Return in about 2 days (around 05/15/2017).   Please inform us of any Emergency Department visits, hospitalizations, or changes in symptoms. Call us before going to the ED for breathing or allergy symptoms since we might be able to fit you in for a sick visit. Feel free to contact us anytime with any questions, problems, or concerns.  It was a pleasure to see you again today!  Websites that have reliable patient information: 1. American Academy of Asthma, Allergy, and Immunology: www.aaaai.org 2. Food Allergy Research and Education (FARE): foodallergy.org 3. Mothers of Asthmatics: http://www.asthmacommunitynetwork.org 4. American College of Allergy, Asthma, and Immunology: www.acaai.org    True Test looks for the following sensitivities:

## 2017-05-13 NOTE — Progress Notes (Signed)
FOLLOW UP  Date of Service/Encounter:  05/13/17   Assessment:   Mild persistent asthma, uncomplicated - improved on ICS  Seasonal and perennial allergic rhinitis (trees, weeds, grasses, dust mites and cockroach)  Allergic contact dermatitis - unknown trigger  Adverse food reaction (strawberries, shrimp, egg, soy) - with negative testing and a rather low risk vague history  Plan/Recommendations:   1. Seasonal and perennial allergic rhinitis (trees, weeds, grasses, dust mites and cockroach) - Your nose was full of clear mucous and your left ear had clear fluid behind it. - Continue with all of your nose sprays and added on Afrin twice daily for a few days to help with turbinate enlargement.  - Continue with: Zyrtec (cetirizine) 10mg  tablet once daily, Nasacort (triamcinolone) one spray per nostril daily and Astelin (azelastine) 2 sprays per nostril 1-2 times daily as needed - You can use an extra dose of the antihistamine, if needed, for breakthrough symptoms.  - Consider nasal saline rinses 1-2 times daily to remove allergens from the nasal cavities as well as help with mucous clearance (this is especially helpful to do before the nasal sprays are given) - Consider allergy shots as a means of long-term control. - Allergy shots "re-train" and "reset" the immune system to ignore environmental allergens and decrease the resulting immune response to those allergens (sneezing, itchy watery eyes, runny nose, nasal congestion, etc). - Allergy shots improve symptoms in 75-85% of patients.  - We can discuss more at the next appointment if the medications are not working for you.  2. Mild persistent asthma, uncomplicated - Lung function looked much better today. - Continue with the current medication regimen.  - Daily controller medication(s): Singulair 10mg  daily and Qvar 1mcg Redihaler 2 puffs twice daily - Prior to physical activity: ProAir 2 puffs 10-15 minutes before physical  activity. - Rescue medications: ProAir 4 puffs every 4-6 hours as needed - Changes during respiratory infections or worsening symptoms: Increase Qvar 56mcg to 4 puffs twice daily for TWO WEEKS. - Asthma control goals:  * Full participation in all desired activities (may need albuterol before activity) * Albuterol use two time or less a week on average (not counting use with activity) * Cough interfering with sleep two time or less a month * Oral steroids no more than once a year * No hospitalizations  3. Allergic contact dermatitis - We placed True Test patches today. - We also placed patch testing for the tea tree oil, black seed oil, and the body wash. - Come back on Wednesday and Friday so that we can see how your body reacts to these.   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  - We will call you in 1-2 weeks with the results. - There is a common protein (tropomysin) between dust mites and shrimp, which might explain some of your symptoms.   5. Return in about 2 days (around 05/15/2017).  Subjective:   Hailey Lam is a 47 y.o. female presenting today for follow up of  Chief Complaint  Patient presents with  . Asthma    better, moving a little better cleaning,     Hailey Lam has a history of the following: Patient Active Problem List   Diagnosis Date Noted  . Seasonal and perennial allergic rhinitis 04/25/2017  . Mild persistent asthma, uncomplicated 40/81/4481  . Allergic contact dermatitis 04/25/2017  . Adverse food reaction 04/25/2017  . Encounter for  routine gynecological examination 08/19/2013  . Edema 04/14/2013  . Neuropathy 04/14/2013  . Atypical chest pain 04/14/2013    History obtained from: chart review and patient.  [place] Primary Care Provider is Lucia Gaskins, MD.     Hailey Lam is a 47 y.o. female presenting for a follow up visit. She was last seen one month ago or so. Her  lung function looked terrible, but she did improve with the albuterol nebulizer treatment. We started her on Qvar 8mcg two puffs BID and Singulair 10mg  daily. She had environmental allergy testing that demonstrated positives to trees, weeds, grasses, dust mites, and cockroach. We continued her on cetirizine 10mg  daily and added on Nasacort one spray per nostril daily as well as Astelin 1-2 sprays per nostril up to twice daily as needed. She also reported problems with strawberries, eggs,, shrimp, and soy but testing was negative.   Since the last visit, she has done well. Hailey Lam's asthma has been well controlled. She has not required rescue medication, experienced nocturnal awakenings due to lower respiratory symptoms, nor have activities of daily living been limited. She has required no Emergency Department or Urgent Care visits for her asthma. She has required zero courses of systemic steroids for asthma exacerbations since the last visit. ACT score today is 25, indicating excellent asthma symptom control.  She feels that the Qvar is doing a excellent job of controlling her symptoms.  She is not using her albuterol often at all.  She is compliant with her nasal sprays including fluticasone and azelastine.  She is also using the montelukast as well as Zyrtec.  She does feel a market improvement with this.  She does endorse some left-sided ear pain.  She has not tried using any Afrin or other nasal decongestants for the symptoms. She has not looked into allergy shots.   Her skin has remained clear.  She is still interested in patch testing.  Specifically, she has brought in black seed oil, tea tree oil, and oil of Olay body wash.  Her reaction to the black seed oil included triggering of her asthma.  She was using this for, including weight loss.  She was using a couple drops under her tongue on a daily basis, but stopped using it when it started causing the asthma symptoms.  She was using the tea tree oil  for her hair.  She was mixing it with vitamin E prior to administering it.  However, she went to the ER one morning after using this with facial erythema and swelling.  She does have pictures of this today.  She was also using the oil of Olay body wash at that time as well.  She is interested in getting True Test patch testing as well.  She also brings into two raw shrimp today for testing.  It seems that she thought that we are going to do patch testing to shrimp.  We did order blood work to screen for the last visit, but it evidently was not collected.  She is interested in confirming testing to strawberry, soy, and egg.  Otherwise, there have been no changes to her past medical history, surgical history, family history, or social history.    Review of Systems: a 14-point review of systems is pertinent for what is mentioned in HPI.  Otherwise, all other systems were negative. Constitutional: negative other than that listed in the HPI Eyes: negative other than that listed in the HPI Ears, nose, mouth, throat, and face: negative other than  that listed in the HPI Respiratory: negative other than that listed in the HPI Cardiovascular: negative other than that listed in the HPI Gastrointestinal: negative other than that listed in the HPI Genitourinary: negative other than that listed in the HPI Integument: negative other than that listed in the HPI Hematologic: negative other than that listed in the HPI Musculoskeletal: negative other than that listed in the HPI Neurological: negative other than that listed in the HPI Allergy/Immunologic: negative other than that listed in the HPI    Objective:   Blood pressure 136/84, pulse 79, temperature 98.2 F (36.8 C), temperature source Oral, resp. rate 16, height 5\' 2"  (1.575 m), weight 207 lb 12.8 oz (94.3 kg), last menstrual period 04/18/2017, SpO2 97 %. Body mass index is 38.01 kg/m.   Physical Exam:  General: Alert, interactive, in no acute  distress. Eyes: No conjunctival injection bilaterally, no discharge on the right, no discharge on the left and no Horner-Trantas dots present. PERRL bilaterally. EOMI without pain. No photophobia.  Ears: Right TM pearly gray with normal light reflex, Left TM pearly gray with normal light reflex, Left OME, Right TM intact without perforation and Left TM intact without perforation.  Nose/Throat: External nose within normal limits, nasal crease present and septum midline. Turbinates markedly edematous and pale with clear discharge. Posterior oropharynx erythematous with cobblestoning in the posterior oropharynx. Tonsils 2+ without exudates.  Tongue without thrush. Lungs: Clear to auscultation without wheezing, rhonchi or rales. No increased work of breathing. CV: Normal S1/S2. No murmurs. Capillary refill <2 seconds.  Skin: Warm and dry, without lesions or rashes. Neuro:   Grossly intact. No focal deficits appreciated. Responsive to questions.  Diagnostic studies:   Spirometry: results normal (FEV1: 2.01/90%, FVC: 2.51/91%, FEV1/FVC: 80%).    Spirometry consistent with normal pattern.   Allergy Studies: True Test placed. She also brought in the tea tree oil, black seed oil, and Oil of Olay body wash that she thinks that she has reacted to. Per guidelines, since these are rinsed off and not left on the skin, we diluted them 1:100 prior to applying them to the skin.        Salvatore Marvel, MD  [facility]

## 2017-05-13 NOTE — Addendum Note (Signed)
Addended by: Horris Latino on: 05/13/2017 02:40 PM   Modules accepted: Orders

## 2017-05-15 ENCOUNTER — Ambulatory Visit: Payer: 59 | Admitting: Allergy

## 2017-05-15 DIAGNOSIS — L235 Allergic contact dermatitis due to other chemical products: Secondary | ICD-10-CM

## 2017-05-15 NOTE — Progress Notes (Signed)
    Follow-up Note  RE: Hailey Lam MRN: [id number] DOB: Feb 12, 1970 Date of Office Visit: 05/15/2017  Primary care provider: Lucia Gaskins, MD Referring provider: Lucia Gaskins, MD   Guadalupe returns to the office today for the initial patch test interpretation, given suspected history of contact dermatitis.    Diagnostics:  TRUE TEST 48 hour reading: Erythematous scaling at #8 (paraben) and erythematous patch at #6 (fragrance mix) Remainder of true test and additional testing is negative  Plan:  Allergic contact dermatitis  The patient has been provided detailed information regarding the substances she is sensitive to, as well as products containing the substances.  Meticulous avoidance of these substances is recommended. If avoidance is not possible, the use of barrier creams or lotions is recommended. She will return in 2 days for final reading  Prudy Feeler, MD [facility]

## 2017-05-17 ENCOUNTER — Encounter: Payer: Self-pay | Admitting: Allergy

## 2017-05-17 ENCOUNTER — Ambulatory Visit: Payer: 59 | Admitting: Allergy

## 2017-05-17 DIAGNOSIS — L235 Allergic contact dermatitis due to other chemical products: Secondary | ICD-10-CM

## 2017-05-17 NOTE — Progress Notes (Signed)
    Follow-up Note  RE: Hailey Lam MRN: [id number] DOB: [birth year] Date of Office Visit: 05/17/2017  Primary care provider: Lucia Gaskins, MD Referring provider: Lucia Gaskins, MD   [name] returns to the office today for the final patch test interpretation, given suspected history of contact dermatitis.    Diagnostics:  TRUE TEST 96  hour reading: fragrance mix #6 with stronger erythematous response.  Tea tree patch from additional products tested is positive with raised erytheatous patch  Plan:  Allergic contact dermatitis  The patient has been provided detailed information regarding the substances she is sensitive to, as well as products containing the substances.  Meticulous avoidance of these substances is recommended. If avoidance is not possible, the use of barrier creams or lotions is recommended. If symptoms persist or progress despite meticulous avoidance of fragrance mix, paraben and tea tree, dermatology evaluation may be warranted.  Prudy Feeler, MD [facility]

## 2017-05-20 LAB — ALLERGEN, STRAWBERRY, F44: Allergen Strawberry IgE: <0.1 kU/L

## 2017-05-20 LAB — EGG COMPONENT PANEL: F233-IgE Ovomucoid: <0.1 kU/L

## 2017-05-20 LAB — ALLERGEN, SHRIMP F24: SHRIMP IGE: 6.14 kU/L — AB

## 2017-05-20 LAB — ALLERGEN SOYBEAN: Soybean IgE: <0.1 kU/L

## 2017-05-22 ENCOUNTER — Encounter: Payer: Self-pay | Admitting: Allergy & Immunology

## 2017-07-01 HISTORY — PX: ABDOMINAL HYSTERECTOMY: SHX81

## 2017-07-26 ENCOUNTER — Encounter: Payer: Self-pay | Admitting: Allergy & Immunology

## 2017-08-16 ENCOUNTER — Encounter: Payer: Self-pay | Admitting: Allergy

## 2017-08-16 ENCOUNTER — Ambulatory Visit: Payer: 59 | Admitting: Allergy

## 2017-08-16 VITALS — BP 110/64 | HR 74 | Temp 98.2°F | Resp 16 | Ht 62.0 in | Wt 208.8 lb

## 2017-08-16 DIAGNOSIS — T781XXD Other adverse food reactions, not elsewhere classified, subsequent encounter: Secondary | ICD-10-CM | POA: Diagnosis not present

## 2017-08-16 DIAGNOSIS — J3089 Other allergic rhinitis: Secondary | ICD-10-CM | POA: Diagnosis not present

## 2017-08-16 DIAGNOSIS — J4530 Mild persistent asthma, uncomplicated: Secondary | ICD-10-CM

## 2017-08-16 DIAGNOSIS — L235 Allergic contact dermatitis due to other chemical products: Secondary | ICD-10-CM

## 2017-08-16 DIAGNOSIS — J302 Other seasonal allergic rhinitis: Secondary | ICD-10-CM

## 2017-08-16 MED ORDER — EPINEPHRINE 0.3 MG/0.3ML IJ SOAJ: 0.3000 mg | Freq: Once | INTRAMUSCULAR | 2 refills | Status: AC

## 2017-08-16 NOTE — Patient Instructions (Addendum)
Seasonal and perennial allergic rhinitis (trees, weeds, grasses, dust mites and cockroach) - Continue with: Zyrtec (cetirizine) 10mg  tablet once daily and Astelin (azelastine) 2 sprays per nostril 1-2 times daily as needed   If you are not having a significant component of nasal congestion and stop the Nasacort and only use for 1-2 weeks at a time if having congestion.  - Consider nasal saline rinses 1-2 times daily to remove allergens from the nasal cavities as well as help with mucous clearance (this is especially helpful to do before the nasal sprays are given) - you are eligible for allergy shots as a means of long-term control if symptoms are not controlled enough with above medications  Mild persistent asthma - Lung function is normal today - Daily controller medication(s): Singulair 10mg  daily.  Decrease Qvar 80mcg Redihaler to 1 puff twice daily for next month.   Let us know if symptoms remain control with reduced dose and will wean more to Qvar 43mcg.   - Prior to physical activity: ProAir 2 puffs 10-15 minutes before physical activity. - Rescue medications: ProAir 4 puffs every 4-6 hours as needed - Changes during respiratory infections or worsening symptoms: Increase Qvar 59mcg to 4 puffs twice daily for TWO WEEKS. - Asthma control goals:  * Full participation in all desired activities (may need albuterol before activity) * Albuterol use two time or less a week on average (not counting use with activity) * Cough interfering with sleep two time or less a month * Oral steroids no more than once a year * No hospitalizations  Allergic contact dermatitis - continue avoidance measures for fragrance mix #6  and Tea tree oil   Adverse food reaction - Continue avoidance of shrimp, soy, egg, and strawberry - will prescribe AuviQ epinephrine device for you just to have in case of an allergic reaction.      Return in 4 months or sooner if needed

## 2017-08-16 NOTE — Progress Notes (Signed)
Follow-up Note  RE: Hailey Lam MRN: [id number] DOB: [birth year] Date of Office Visit: 08/16/2017   History of present illness: Hailey Lam is a 47 y.o. female presenting today for follow-up of mild persistent asthma, allergic rhinitis, contact dermatitis and adverse food reaction.  She was last seen for her initial visit on April 25, 2017 by Dr. Ernst Bowler.  After that visit she did have patch testing performed that did show sensitivity to fragrance mix and tea tree oil.  She has been trying to stay away from these products.  She states she did buy a new mineral-based make-up and she still developed a rash even though it did not contain any of the substances she has a contact allergy to. In regards to her asthma she states she has done very well without any flareups requiring ED or urgent care visits or any oral steroids.  She denies any nighttime awakenings.  She takes Singulair daily and is taking Qvar 80 mcg 2 puffs twice a day.  She is wondering if any of her medications can be stopped at this time. In regards to her allergies she states she just has some runny nose and is taking Astelin 1 spray each nostril twice a day and Nasacort 1 spray each nostril once a day.  She also takes daily Zyrtec. She continues to avoid strawberries,eggs, shrimp, and soy.  She states there are certain types of shrimp that she has had issues with but not all should not cause her to have a problem.  She does not have an epinephrine device.  She denies any major health changes however she did have a hysterectomy on July 01, 2017.  Review of systems: Review of Systems  Constitutional: Negative for chills, fever and malaise/fatigue.  HENT: Positive for congestion. Negative for ear discharge, ear pain, nosebleeds, sinus pain and sore throat.   Eyes: Negative for pain, discharge and redness.  Respiratory: Negative for cough, shortness of breath and wheezing.   Cardiovascular: Negative for chest pain.    Gastrointestinal: Negative for abdominal pain, constipation, diarrhea, heartburn, nausea and vomiting.  Musculoskeletal: Negative for joint pain.  Skin: Negative for itching and rash.  Neurological: Negative for headaches.    All other systems negative unless noted above in HPI  Past medical/social/surgical/family history have been reviewed and are unchanged unless specifically indicated below.  No changes  Medication List: Allergies as of 08/16/2017      Reactions   Minocycline Swelling   Sulfa Antibiotics    Tea Tree Oil Swelling   Reaction to to tea tree oil, Facial swelling      Medication List        Accurate as of 08/16/17  4:27 PM. Always use your most recent med list.          albuterol 108 (90 Base) MCG/ACT inhaler Commonly known as:  PROVENTIL HFA;VENTOLIN HFA   Azelastine HCl 0.15 % Soln Place 1-2 sprays into both nostrils 2 (two) times daily.   beclomethasone 80 MCG/ACT inhaler Commonly known as:  QVAR REDIHALER Inhale 2 puffs into the lungs 2 (two) times daily.   EPINEPHrine 0.3 mg/0.3 mL Soaj injection Commonly known as:  AUVI-Q Inject 0.3 mLs (0.3 mg total) into the muscle once for 1 dose. As directed for life-threatening allergic reactions   montelukast 10 MG tablet Commonly known as:  SINGULAIR Take 1 tablet (10 mg total) by mouth at bedtime.   NOREL SR PO Take by mouth as needed.   ZYRTEC PO Take  by mouth as needed.       Known medication allergies: Allergies  Allergen Reactions  . Minocycline Swelling  . Sulfa Antibiotics   . Tea Tree Oil Swelling    Reaction to to tea tree oil, Facial swelling     Physical examination: Blood pressure 110/64, pulse 74, temperature 98.2 F (36.8 C), temperature source Oral, resp. rate 16, height 5\' 2"  (1.575 m), weight 208 lb 12.8 oz (94.7 kg), last menstrual period 04/18/2017, SpO2 98 %.  General: Alert, interactive, in no acute distress. HEENT: PERRLA, TMs pearly gray, turbinates mildly  edematous with clear discharge, post-pharynx non erythematous. Neck: Supple without lymphadenopathy. Lungs: Clear to auscultation without wheezing, rhonchi or rales. {no increased work of breathing. CV: Normal S1, S2 without murmurs. Abdomen: Nondistended, nontender. Skin: Warm and dry, without lesions or rashes. Extremities:  No clubbing, cyanosis or edema. Neuro:   Grossly intact.  Diagnositics/Labs: Labs:  Component     Latest Ref Rng & Units 05/13/2017  Class Description      Comment  F232-IgE Ovalbumin     Class 0 kU/L <0.10  F233-IgE Ovomucoid     Class 0 kU/L <0.10  Shrimp IgE     Class IV kU/L 6.14 (A)  Soybean IgE     Class 0 kU/L <0.10  Allergen Strawberry IgE     Class 0 kU/L <0.10    Spirometry: FEV1: 1.99L 90%, FVC: 2.67L 97%, ratio consistent with Nonobstructive pattern  Assessment and plan:   Seasonal and perennial allergic rhinitis (trees, weeds, grasses, dust mites and cockroach) - Continue with: Zyrtec (cetirizine) 10mg  tablet once daily and Astelin (azelastine) 2 sprays per nostril 1-2 times daily as needed   If you are not having a significant component of nasal congestion and stop the Nasacort and only use for 1-2 weeks at a time if having congestion.  - Consider nasal saline rinses 1-2 times daily to remove allergens from the nasal cavities as well as help with mucous clearance (this is especially helpful to do before the nasal sprays are given) - you are eligible for allergy shots as a means of long-term control if symptoms are not controlled enough with above medications  Mild persistent asthma - Lung function is normal today - Daily controller medication(s): Singulair 10mg  daily.  Decrease Qvar 74mcg Redihaler to 1 puff twice daily for next month.   Let us know if symptoms remain control with reduced dose and will wean more to Qvar 40mcg.   - Prior to physical activity: ProAir 2 puffs 10-15 minutes before physical activity. - Rescue medications: ProAir  4 puffs every 4-6 hours as needed - Changes during respiratory infections or worsening symptoms: Increase Qvar 63mcg to 4 puffs twice daily for TWO WEEKS. - Asthma control goals:  * Full participation in all desired activities (may need albuterol before activity) * Albuterol use two time or less a week on average (not counting use with activity) * Cough interfering with sleep two time or less a month * Oral steroids no more than once a year * No hospitalizations  Allergic contact dermatitis - continue avoidance measures for fragrance mix #6  and Tea tree oil   Adverse food reaction - Continue avoidance of shrimp, soy, egg, and strawberry - will prescribe AuviQ epinephrine device for you just to have in case of an allergic reaction.    Return in 4 months or sooner if needed  I appreciate the opportunity to take part in Aisa's care. Please do not hesitate  to contact me with questions.  Sincerely,   Prudy Feeler, MD Allergy/Immunology [facility]

## 2017-08-30 MED ORDER — BECLOMETHASONE DIPROP HFA 40 MCG/ACT IN AERB: 1.0000 | INHALATION_SPRAY | Freq: Two times a day (BID) | RESPIRATORY_TRACT | 5 refills | Status: DC

## 2017-08-30 NOTE — Addendum Note (Signed)
Addended by: Lucrezia Starch I on: 08/30/2017 02:46 PM   Modules accepted: Orders

## 2017-09-28 ENCOUNTER — Other Ambulatory Visit: Payer: Self-pay | Admitting: Allergy & Immunology

## 2017-09-29 ENCOUNTER — Other Ambulatory Visit: Payer: Self-pay | Admitting: Allergy & Immunology

## 2017-09-30 NOTE — Telephone Encounter (Signed)
Patient will have enough medication to last past her appointment due date with the additional refill. She will need to have an office visit prior to further refills.

## 2017-12-13 ENCOUNTER — Ambulatory Visit: Payer: 59 | Admitting: Allergy

## 2017-12-27 ENCOUNTER — Encounter: Payer: Self-pay | Admitting: Allergy & Immunology

## 2017-12-27 ENCOUNTER — Ambulatory Visit: Payer: 59 | Admitting: Allergy & Immunology

## 2017-12-27 VITALS — BP 124/86 | HR 76 | Resp 16

## 2017-12-27 DIAGNOSIS — J4530 Mild persistent asthma, uncomplicated: Secondary | ICD-10-CM | POA: Diagnosis not present

## 2017-12-27 DIAGNOSIS — J3089 Other allergic rhinitis: Secondary | ICD-10-CM | POA: Diagnosis not present

## 2017-12-27 DIAGNOSIS — L239 Allergic contact dermatitis, unspecified cause: Secondary | ICD-10-CM | POA: Diagnosis not present

## 2017-12-27 DIAGNOSIS — T7800XD Anaphylactic reaction due to unspecified food, subsequent encounter: Secondary | ICD-10-CM | POA: Diagnosis not present

## 2017-12-27 DIAGNOSIS — J302 Other seasonal allergic rhinitis: Secondary | ICD-10-CM

## 2017-12-27 MED ORDER — BECLOMETHASONE DIPROP HFA 40 MCG/ACT IN AERB: 1.0000 | INHALATION_SPRAY | Freq: Two times a day (BID) | RESPIRATORY_TRACT | 1 refills | Status: DC

## 2017-12-27 MED ORDER — MONTELUKAST SODIUM 10 MG PO TABS: ORAL_TABLET | ORAL | 1 refills | Status: DC

## 2017-12-27 NOTE — Progress Notes (Signed)
FOLLOW UP  Date of Service/Encounter:  12/27/17   Assessment:   Mild persistent asthma, uncomplicated  Seasonal and perennial allergic rhinitis(trees, weeds, grasses, dust mites and cockroach)  Allergic contact dermatitis (parabens, fragrance mix, tea tree)  Adversefoodreaction (strawberries, shrimp, egg, soy)   Plan/Recommendations:   1. Seasonal and perennial allergic rhinitis (trees, weeds, grasses, dust mites and cockroach) - Continue with: Zyrtec (cetirizine) 10mg  tablet once daily, Nasacort (triamcinolone) one spray per nostril daily and Astelin (azelastine) 2 sprays per nostril 1-2 times daily as needed - You can use an extra dose of the antihistamine, if needed, for breakthrough symptoms.  - Consider nasal saline rinses 1-2 times daily to remove allergens from the nasal cavities as well as help with mucous clearance (this is especially helpful to do before the nasal sprays are given) - Consider allergy shots as a means of long-term control.   2. Mild persistent asthma, uncomplicated - Lung function looked much better today. - I think you definitely need something on board daily, so add on the Singulair daily for two weeks to see if this helps. - If this is not working, add on the Qvar 57mcg two puffs at night.  - Daily controller medication(s): Singulair 10mg  daily - Prior to physical activity: ProAir 2 puffs 10-15 minutes before physical activity. - Rescue medications: ProAir 4 puffs every 4-6 hours as needed - Asthma control goals:  * Full participation in all desired activities (may need albuterol before activity) * Albuterol use two time or less a week on average (not counting use with activity) * Cough interfering with sleep two time or less a month * Oral steroids no more than once a year * No hospitalizations  3. Allergic contact dermatitis - Try using Ethique conditioner bars for shaving (they have ingredients listed on their website). Dorene Ar also  has sampler packets available.  - Also try olive oil or aloe vera for shaving instead.  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  - There is a common protein (tropomysin) between dust mites and shrimp, which might explain some of your symptoms.   5. Return in about 3 months (around 03/28/2018).  Subjective:   Irmgard Rampersaud is a 47 y.o. female presenting today for follow up of  Chief Complaint  Patient presents with  . Follow-up    Yalexa Bevilacqua has a history of the following: Patient Active Problem List   Diagnosis Date Noted  . Seasonal and perennial allergic rhinitis 04/25/2017  . Mild persistent asthma, uncomplicated 07/37/1062  . Allergic contact dermatitis 04/25/2017  . Adverse food reaction 04/25/2017  . Encounter for routine gynecological examination 08/19/2013  . Edema 04/14/2013  . Neuropathy 04/14/2013  . Atypical chest pain 04/14/2013    History obtained from: chart review and patient.  [facility] Primary Care Provider is Lucia Gaskins, MD.     Shigeko is a 47 y.o. female presenting for a follow up visit. She was last seen in April 2019. At that time, she had uncontrolled allergic rhinitis. We did recommend adding Afrin for a couple of days to help with nasal drainage. We continued with cetirizine, Nasacort, and Astelin nasal sprays. Lung function looked great. We continued her on Singulair 10mg  daily as well as Qvar 61mcg two puffs BID.   Since the last visit, she has mostly done well. However it all went downhill over Thanksgiving. She went out of town for Thanksgiving and all of her boys left  the house after the holiday. It was a whirlwind. Also her husband started using [employer] body wash. This is flaring her breathing. Then every night she is using her rescue inhaler because her husband smells "great".   She is also having some problems with the Qvar. At her last visit with Dr. Nelva Bush,  she was taken down to Qvar 32mcg (from Qvar 40mcg). There were some problems with getting the prescription to the pharmacy, so she never ended up starting it. In any case, she is not using a controller at all at this point. ACT today is 17, indicating excellent asthma control. She has not needed any ED visits for UC visits for asthma. However, she does have coughing at night, upwards of 2-3 nights per week.   Her skin has been fairly well controlled. She has changed all of her hair products to ones that do not contain her triggering chemicals. In the interim, she went to see a dermatologist where she was diagnosed with seborrheic dermatitis. She was placed on some kind of compounded oil (she thinks that it contains a steroids). She was also placed on a "solution" to use. She has found some ointments to use and she has changed her laundry detergent and soaps and shampoos. Currently, she is using Basis soap, which unfortunately is around 3 per bar.   Her allergic rhinitis is fairly well controlled but she does not use her nasal sprays on a regular basis. She does have cetirizine on hand to use. She has not required any antibiotics or prednisone since the last visit. She continues to avoid all of her triggering foods, including shrimp, strawberries, and cinnamon (some are not confirmed with testing).   Otherwise, there have been no changes to her past medical history, surgical history, family history, or social history.    Review of Systems: a 14-point review of systems is pertinent for what is mentioned in HPI.  Otherwise, all other systems were negative.  Constitutional: negative other than that listed in the HPI Eyes: negative other than that listed in the HPI Ears, nose, mouth, throat, and face: negative other than that listed in the HPI Respiratory: negative other than that listed in the HPI Cardiovascular: negative other than that listed in the HPI Gastrointestinal: negative other than that  listed in the HPI Genitourinary: negative other than that listed in the HPI Integument: negative other than that listed in the HPI Hematologic: negative other than that listed in the HPI Musculoskeletal: negative other than that listed in the HPI Neurological: negative other than that listed in the HPI Allergy/Immunologic: negative other than that listed in the HPI    Objective:   Blood pressure 124/86, pulse 76, resp. rate 16, last menstrual period 04/18/2017, SpO2 98 %. There is no height or weight on file to calculate BMI.   Physical Exam:  General: Alert, interactive, in no acute distress. Pleasant and very talkative.  Eyes: No conjunctival injection bilaterally, no discharge on the right, no discharge on the left and no Horner-Trantas dots present. PERRL bilaterally. EOMI without pain. No photophobia.  Ears: Right TM pearly gray with normal light reflex, Left TM pearly gray with normal light reflex, Right TM intact without perforation and Left TM intact without perforation.  Nose/Throat: External nose within normal limits and septum midline. Turbinates edematous and pale with clear discharge. Posterior oropharynx erythematous without cobblestoning in the posterior oropharynx. Tonsils 2+ without exudates.  Tongue without thrush. Lungs: Clear to auscultation without wheezing, rhonchi or  rales. No increased work of breathing. CV: Normal S1/S2. No murmurs. Capillary refill <2 seconds.  Skin: Warm and dry, without lesions or rashes. Neuro:   Grossly intact. No focal deficits appreciated. Responsive to questions.  Diagnostic studies:   Spirometry: results normal (FEV1: 1.84/90%, FVC: 2.38/89%, FEV1/FVC: 77%).    Spirometry consistent with normal pattern.  Allergy Studies: none     Salvatore Marvel, MD  [facility]

## 2017-12-27 NOTE — Patient Instructions (Addendum)
1. Seasonal and perennial allergic rhinitis (trees, weeds, grasses, dust mites and cockroach) - Continue with: Zyrtec (cetirizine) 10mg  tablet once daily, Nasacort (triamcinolone) one spray per nostril daily and Astelin (azelastine) 2 sprays per nostril 1-2 times daily as needed - You can use an extra dose of the antihistamine, if needed, for breakthrough symptoms.  - Consider nasal saline rinses 1-2 times daily to remove allergens from the nasal cavities as well as help with mucous clearance (this is especially helpful to do before the nasal sprays are given) - Consider allergy shots as a means of long-term control.   2. Mild persistent asthma, uncomplicated - Lung function looked much better today. - I think you definitely need something on board daily, so add on the Singulair daily for two weeks to see if this helps. - If this is not working, add on the Qvar 76mcg two puffs at night.  - Daily controller medication(s): Singulair 10mg  daily - Prior to physical activity: ProAir 2 puffs 10-15 minutes before physical activity. - Rescue medications: ProAir 4 puffs every 4-6 hours as needed - Asthma control goals:  * Full participation in all desired activities (may need albuterol before activity) * Albuterol use two time or less a week on average (not counting use with activity) * Cough interfering with sleep two time or less a month * Oral steroids no more than once a year * No hospitalizations  3. Allergic contact dermatitis - Try using Ethique conditioner bars for shaving (they have ingredients listed on their website). Dorene Ar also has sampler packets available.  - Also try olive oil or aloe vera for shaving instead.  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  - There is a common protein (tropomysin) between dust mites and shrimp, which might explain some of your symptoms.   5. Return in about 3 months (around  03/28/2018).   Please inform us of any Emergency Department visits, hospitalizations, or changes in symptoms. Call us before going to the ED for breathing or allergy symptoms since we might be able to fit you in for a sick visit. Feel free to contact us anytime with any questions, problems, or concerns.  It was a pleasure to see you again today!  Websites that have reliable patient information: 1. American Academy of Asthma, Allergy, and Immunology: www.aaaai.org 2. Food Allergy Research and Education (FARE): foodallergy.org 3. Mothers of Asthmatics: http://www.asthmacommunitynetwork.org 4. American College of Allergy, Asthma, and Immunology: MonthlyElectricBill.co.uk   Make sure you are registered to vote! If you have moved or changed any of your contact information, you will need to get this updated before voting!

## 2017-12-28 ENCOUNTER — Encounter: Payer: Self-pay | Admitting: Allergy & Immunology

## 2018-03-28 ENCOUNTER — Encounter: Payer: Self-pay | Admitting: Allergy & Immunology

## 2018-03-28 ENCOUNTER — Ambulatory Visit: Payer: BLUE CROSS/BLUE SHIELD | Admitting: Allergy & Immunology

## 2018-03-28 VITALS — BP 130/78 | HR 85 | Resp 17

## 2018-03-28 DIAGNOSIS — T7800XD Anaphylactic reaction due to unspecified food, subsequent encounter: Secondary | ICD-10-CM

## 2018-03-28 DIAGNOSIS — J4530 Mild persistent asthma, uncomplicated: Secondary | ICD-10-CM

## 2018-03-28 DIAGNOSIS — J3089 Other allergic rhinitis: Secondary | ICD-10-CM | POA: Diagnosis not present

## 2018-03-28 DIAGNOSIS — L239 Allergic contact dermatitis, unspecified cause: Secondary | ICD-10-CM | POA: Diagnosis not present

## 2018-03-28 DIAGNOSIS — J302 Other seasonal allergic rhinitis: Secondary | ICD-10-CM

## 2018-03-28 MED ORDER — ALBUTEROL SULFATE HFA 108 (90 BASE) MCG/ACT IN AERS: 2.0000 | INHALATION_SPRAY | RESPIRATORY_TRACT | 1 refills | Status: DC | PRN

## 2018-03-28 NOTE — Patient Instructions (Addendum)
1. Seasonal and perennial allergic rhinitis (trees, weeds, grasses, dust mites and cockroach) - Continue with: Zyrtec (cetirizine) 10mg  tablet once daily  - Use nasal saline rinses (NeilMed or Netti Pot) 1-2 times daily to move allergens and thin out mucous.  - Consider allergy shots as a means of long-term control.   2. Mild persistent asthma, uncomplicated - Lung function looked much better today. - You can try using the Singulair on a daily basis to help with your breathing and congestion.  - Daily controller medication(s): Singulair 10mg  daily - Prior to physical activity: ProAir 2 puffs 10-15 minutes before physical activity. - Rescue medications: ProAir 4 puffs every 4-6 hours as needed - Asthma control goals:  * Full participation in all desired activities (may need albuterol before activity) * Albuterol use two time or less a week on average (not counting use with activity) * Cough interfering with sleep two time or less a month * Oral steroids no more than once a year * No hospitalizations  3. Allergic contact dermatitis - I cannot wait to hear how the homemade deodorant goes!  - Continue to avoid your other triggers.   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  - Epinephrine is up to date.   5. Return in about 6 months (around 09/28/2018).   Please inform us of any Emergency Department visits, hospitalizations, or changes in symptoms. Call us before going to the ED for breathing or allergy symptoms since we might be able to fit you in for a sick visit. Feel free to contact us anytime with any questions, problems, or concerns.  It was a pleasure to see you again today!  Websites that have reliable patient information: 1. American Academy of Asthma, Allergy, and Immunology: www.aaaai.org 2. Food Allergy Research and Education (FARE): foodallergy.org 3. Mothers of Asthmatics:  http://www.asthmacommunitynetwork.org 4. American College of Allergy, Asthma, and Immunology: MonthlyElectricBill.co.uk   Make sure you are registered to vote! If you have moved or changed any of your contact information, you will need to get this updated before voting!

## 2018-03-28 NOTE — Progress Notes (Signed)
FOLLOW UP  Date of Service/Encounter:  03/28/18   Assessment:   Mild persistent asthma, uncomplicated  Seasonal and perennial allergic rhinitis(trees, weeds, grasses, dust mites and cockroach)  Allergic contact dermatitis (parabens, fragrance mix, tea tree)  Adversefoodreaction (strawberries, shrimp, egg, soy)   Plan/Recommendations:   1. Seasonal and perennial allergic rhinitis (trees, weeds, grasses, dust mites and cockroach) - Continue with: Zyrtec (cetirizine) 10mg  tablet once daily  - Use nasal saline rinses (NeilMed or Netti Pot) 1-2 times daily to move allergens and thin out mucous.  - Consider allergy shots as a means of long-term control.   2. Mild persistent asthma, uncomplicated - Lung function looked much better today. - You can try using the Singulair on a daily basis to help with your breathing and congestion.  - Daily controller medication(s): Singulair 10mg  daily - Prior to physical activity: ProAir 2 puffs 10-15 minutes before physical activity. - Rescue medications: ProAir 4 puffs every 4-6 hours as needed - Asthma control goals:  * Full participation in all desired activities (may need albuterol before activity) * Albuterol use two time or less a week on average (not counting use with activity) * Cough interfering with sleep two time or less a month * Oral steroids no more than once a year * No hospitalizations  3. Allergic contact dermatitis - I cannot wait to hear how the homemade deodorant goes!  - Continue to avoid your other triggers.   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  - Epinephrine is up to date.   5. Return in about 6 months (around 09/28/2018).  Subjective:   Hailey Lam is a 48 y.o. female presenting today for follow up of  Chief Complaint  Patient presents with  . Asthma    Hailey Lam has a history of the following: Patient Active Problem List   Diagnosis Date Noted  . Seasonal and perennial allergic rhinitis 04/25/2017  . Mild persistent asthma without complication 16/10/9602  . Allergic contact dermatitis 04/25/2017  . Adverse food reaction 04/25/2017  . Encounter for routine gynecological examination 08/19/2013  . Edema 04/14/2013  . Neuropathy 04/14/2013  . Atypical chest pain 04/14/2013    History obtained from: chart review and patient.  Hailey Lam is a 48 y.o. female presenting for a follow up visit.  She was last seen in December 2019.  At that time, we continued Zyrtec, Nasacort, and Astelin.  We did discuss allergy shots as a means of long-term control.  For her asthma, her lung function looked much better.  I recommended that she continue to stay on a daily controller medication.  We decided to go with Singulair for a couple weeks to see if it would provide any relief.  For her allergic contact dermatitis, we discussed different products to help with her skin irritation.  We also recommended avoidance of shrimp, strawberries, and cinnamon.  Since the last visit, she has continued to have problems. She has been using baby wipes and coconut oil for her deodorant. She does this several times throughout the day. She has used the salt crystals, Toms, Native, and multiple others but she has reacted to all of these. She did find a DIY natural deodorant that she is going to try that contains coconut oil and baking soda and corn starch.    Asthma/Respiratory Symptom History: She remains on Singulair and she does think that this improves her symptoms. She can tell when she does not use  it. She felt that she was well controlled but yesterday at the gym she had some problems with breathing. She had an attack that she could not get under control. She almost passed out yesterday from this. She took her inhaler and used Singulair and she was fine after that. She is really just using her Singualir on daily. She has not needed to go to the ED and she  has been sleeping well at night.   Allergic Rhinitis Symptom History: She was using her Astelin daily until she developed nose bleeds. She does use her cetirizine on a daily basis. She has not been using her Nasacort. She feels that her symptoms are under good control. She has not needed any antibiotics at all since the last visit.   Food Allergy Symptom History: She has had no accidetnal ingestions at all. Her Hailey Lam is up to date. She is not interested in retesting at this point. She has not new food concerns, but does feel empowered knowing her triggers.   Otherwise, there have been no changes to her past medical history, surgical history, family history, or social history.    Review of Systems  Constitutional: Negative.  Negative for fever, malaise/fatigue and weight loss.  HENT: Negative.  Negative for congestion, ear discharge and ear pain.   Eyes: Negative for pain, discharge and redness.  Respiratory: Negative for cough, sputum production, shortness of breath and wheezing.   Cardiovascular: Negative.  Negative for chest pain and palpitations.  Gastrointestinal: Negative for abdominal pain and heartburn.  Skin: Negative.  Negative for itching and rash.  Neurological: Negative for dizziness and headaches.  Endo/Heme/Allergies: Negative for environmental allergies. Does not bruise/bleed easily.       Objective:   Blood pressure 130/78, pulse 85, resp. rate 17, last menstrual period 04/18/2017, SpO2 95 %. There is no height or weight on file to calculate BMI.   Physical Exam:  Physical Exam  Constitutional: She appears well-developed.  Talkative delightful female.   HENT:  Head: Normocephalic and atraumatic.  Right Ear: Tympanic membrane, external ear and ear canal normal.  Left Ear: Tympanic membrane and ear canal normal.  Nose: Mucosal edema and rhinorrhea present. No nasal deformity or septal deviation. No epistaxis. Right sinus exhibits no maxillary sinus tenderness and  no frontal sinus tenderness. Left sinus exhibits no maxillary sinus tenderness and no frontal sinus tenderness.  Mouth/Throat: Uvula is midline and oropharynx is clear and moist. Mucous membranes are not pale and not dry.  No epistaxis present.   Eyes: Pupils are equal, round, and reactive to light. Conjunctivae and EOM are normal. Right eye exhibits no chemosis and no discharge. Left eye exhibits no chemosis and no discharge. Right conjunctiva is not injected. Left conjunctiva is not injected.  Cardiovascular: Normal rate, regular rhythm and normal heart sounds.  Respiratory: Effort normal and breath sounds normal. No accessory muscle usage. No tachypnea. No respiratory distress. She has no wheezes. She has no rhonchi. She has no rales. She exhibits no tenderness.  Clear air movement in all lung fields.  Lymphadenopathy:    She has no cervical adenopathy.  Neurological: She is alert.  Skin: No abrasion, no petechiae and no rash noted. Rash is not papular, not vesicular and not urticarial. No erythema. No pallor.  Skin looks quite good today. There are some hyperpigmented lesions on the face, otherwise her skin is markedly improved compared to the last visit.   Psychiatric: She has a normal mood and affect.  Diagnostic studies:    Spirometry: results normal (FEV1: 1.94/94%, FVC: 2.55/95%, FEV1/FVC: 76%).    Spirometry consistent with normal pattern.   Allergy Studies: none        Salvatore Marvel, MD  [facility]

## 2018-03-30 ENCOUNTER — Encounter: Payer: Self-pay | Admitting: Allergy & Immunology

## 2018-06-20 DIAGNOSIS — E042 Nontoxic multinodular goiter: Secondary | ICD-10-CM | POA: Insufficient documentation

## 2018-06-23 ENCOUNTER — Other Ambulatory Visit: Payer: Self-pay | Admitting: *Deleted

## 2018-06-23 MED ORDER — MONTELUKAST SODIUM 10 MG PO TABS: ORAL_TABLET | ORAL | 0 refills | Status: DC

## 2018-10-03 ENCOUNTER — Ambulatory Visit: Payer: BLUE CROSS/BLUE SHIELD | Admitting: Allergy & Immunology

## 2018-10-03 ENCOUNTER — Other Ambulatory Visit: Payer: Self-pay

## 2018-10-03 ENCOUNTER — Encounter: Payer: Self-pay | Admitting: Allergy & Immunology

## 2018-10-03 VITALS — BP 122/90 | HR 80 | Temp 98.3°F | Resp 18 | Ht 62.99 in | Wt 217.8 lb

## 2018-10-03 DIAGNOSIS — J3089 Other allergic rhinitis: Secondary | ICD-10-CM | POA: Diagnosis not present

## 2018-10-03 DIAGNOSIS — J4530 Mild persistent asthma, uncomplicated: Secondary | ICD-10-CM | POA: Diagnosis not present

## 2018-10-03 DIAGNOSIS — L232 Allergic contact dermatitis due to cosmetics: Secondary | ICD-10-CM

## 2018-10-03 DIAGNOSIS — T7800XD Anaphylactic reaction due to unspecified food, subsequent encounter: Secondary | ICD-10-CM

## 2018-10-03 DIAGNOSIS — J302 Other seasonal allergic rhinitis: Secondary | ICD-10-CM

## 2018-10-03 NOTE — Progress Notes (Signed)
FOLLOW UP  Date of Service/Encounter:  10/03/18   Assessment:   Mild persistent asthma, uncomplicated  Seasonal and perennial allergic rhinitis(trees, weeds, grasses, dust mites and cockroach)  Allergic contact dermatitis(parabens, fragrance mix, tea tree)  Adversefoodreaction (strawberries, shrimp, egg, soy)  Poor compliance - secondary to preference for holistic therapies  Plan/Recommendations:   1. Seasonal and perennial allergic rhinitis (trees, weeds, grasses, dust mites and cockroach) - Continue with: Zyrtec (cetirizine) 10mg  tablet once daily  - Use nasal saline rinses (NeilMed or Netti Pot) 1-2 times daily to move allergens and thin out mucous.  - Consider allergy shots as a means of long-term control.   2. Mild persistent asthma, uncomplicated - Lung function looked stable today.  - Neb and teaching provided. - Daily controller medication(s): Singulair (montelukast) 10mg  daily EVERY DAY - Prior to physical activity: albuterol 2 puffs 10-15 minutes before physical activity (if this is a trigger) - Rescue medications: albuterol 4 puffs every 4-6 hours as needed or albuterol nebulizer one vial every 4-6 hours as needed - Changes during respiratory infections or worsening symptoms: Add on Qvar 48mcg to 2 puffs three times daily for ONE TO TWO WEEKS. - Asthma control goals:  * Full participation in all desired activities (may need albuterol before activity) * Albuterol use two time or less a week on average (not counting use with activity) * Cough interfering with sleep two time or less a month * Oral steroids no more than once a year * No hospitalizations   3. Allergic contact dermatitis - It looks like you have your cosmetics all figured out. - Continue to use these products and avoid your other triggers.   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  - Epinephrine is up to date.   5. Return  in about 6 months (around 04/02/2019). This can be an in-person, a virtual Webex or a telephone follow up visit.   Subjective:   Hailey Lam is a 48 y.o. female presenting today for follow up of  Chief Complaint  Patient presents with   Allergic Rhinitis     doing good    Hailey Lam has a history of the following: Patient Active Problem List   Diagnosis Date Noted   Seasonal and perennial allergic rhinitis 04/25/2017   Mild persistent asthma without complication [id number]   Allergic contact dermatitis 04/25/2017   Adverse food reaction 04/25/2017   Encounter for routine gynecological examination 08/19/2013   Edema 04/14/2013   Neuropathy 04/14/2013   Atypical chest pain 04/14/2013    History obtained from: chart review and patient.  Hailey Lam is a 48 y.o. female presenting for a follow up visit.  She was last seen in March 2020.  At that time, her rhinitis was controlled with Zyrtec 1-2 times daily.  Her asthma was much better controlled with the use of Singulair 10 mg daily.  She has a history of allergic contact dermatitis and she was actually going to try making her own deodorant.  She has a history of anaphylaxis to shrimp, strawberries, and cinnamon.  Her EpiPen was up-to-date.  Since last visit, she has mostly done well.   She did find a brand of deodorant that she can use - [employer]'s Sensitive Skin. She can use the coconut pineapple and jasmine tea. Apparently the coconut pineapple is the best. Her husband does not like the smell of the jasmine tea. She has not been having any problems with cosmetics because  she has not been using them with the COVID-19 pandemic in full swing.   Asthma/Respiratory Symptom History: She reports that her breathing has not been great. She did have to use her albuterol MDI and nebulizer. She has had nebulizer for 20 years and it is making weird sounds. She has been needing to use it for the last couple of weeks. She also reports that this  first became a problem when they got her [employer]. She does actually have an ICS (Qvar) that she was on routinely at this first time that I saw her, but apparently once I mention it during the visit today, she actually tells me that she started using it over the last two weeks in order to "get improved for the visit today".   Allergic Rhinitis Symptom History: She is not using her nasal saline rinses. She actually never opened the package at all. She is not using any nasal steroid rinses and is not using any antihistamines. She has not needed any antibitoics at all since the last visit.  Food Allergy Symptom History: She continues to avoid all of her triggering foods. She has had no accidental ingestions whatsoever. EpiPen is up to date, as of the last visit, although she is not sure where it is.   Otherwise, there have been no changes to her past medical history, surgical history, family history, or social history.    Review of Systems  Constitutional: Negative.  Negative for chills, fever, malaise/fatigue and weight loss.  HENT: Negative.  Negative for congestion, ear discharge, ear pain, sinus pain and sore throat.   Eyes: Negative for pain, discharge and redness.  Respiratory: Negative for cough, sputum production, shortness of breath and wheezing.   Cardiovascular: Negative.  Negative for chest pain and palpitations.  Gastrointestinal: Negative for abdominal pain, heartburn, nausea and vomiting.  Skin: Negative.  Negative for itching and rash.  Neurological: Negative for dizziness and headaches.  Endo/Heme/Allergies: Negative for environmental allergies. Does not bruise/bleed easily.       Objective:   Blood pressure 122/90, pulse 80, temperature 98.3 F (36.8 C), temperature source Temporal, resp. rate 18, height 5' 2.99" (1.6 m), weight 217 lb 12.8 oz (98.8 kg), last menstrual period 04/18/2017, SpO2 100 %. Body mass index is 38.59 kg/m.   Physical Exam:  Physical Exam    Constitutional: She appears well-developed.  Smiling and amusing as always.   HENT:  Head: Normocephalic and atraumatic.  Right Ear: Tympanic membrane, external ear and ear canal normal.  Left Ear: Tympanic membrane, external ear and ear canal normal.  Nose: Mucosal edema and rhinorrhea present. No nasal deformity or septal deviation. No epistaxis. Right sinus exhibits no maxillary sinus tenderness and no frontal sinus tenderness. Left sinus exhibits no maxillary sinus tenderness and no frontal sinus tenderness.  Mouth/Throat: Uvula is midline and oropharynx is clear and moist. Mucous membranes are not pale and not dry.  Eyes: Pupils are equal, round, and reactive to light. Conjunctivae and EOM are normal. Right eye exhibits no chemosis and no discharge. Left eye exhibits no chemosis and no discharge. Right conjunctiva is not injected. Left conjunctiva is not injected.  Cardiovascular: Normal rate, regular rhythm and normal heart sounds.  Respiratory: Effort normal and breath sounds normal. No accessory muscle usage. No tachypnea. No respiratory distress. She has no wheezes. She has no rhonchi. She has no rales. She exhibits no tenderness.  No increased work of breathing noted.   Lymphadenopathy:    She has no cervical  adenopathy.  Neurological: She is alert.  Skin: No abrasion, no petechiae and no rash noted. Rash is not papular, not vesicular and not urticarial. No erythema. No pallor.  No eczematous or urticarial lesions noted. Skin actually looks fairly good today.   Psychiatric: She has a normal mood and affect.     Diagnostic studies:    Spirometry: results normal (FEV1: 1.99/98%, FVC: 2.34/88%, FEV1/FVC: 85%).    Spirometry consistent with normal pattern.   Allergy Studies: none           Salvatore Marvel, MD  [facility]

## 2018-10-03 NOTE — Patient Instructions (Addendum)
1. Seasonal and perennial allergic rhinitis (trees, weeds, grasses, dust mites and cockroach) - Continue with: Zyrtec (cetirizine) 10mg  tablet once daily  - Use nasal saline rinses (NeilMed or Netti Pot) 1-2 times daily to move allergens and thin out mucous.  - Consider allergy shots as a means of long-term control.   2. Mild persistent asthma, uncomplicated - Lung function looked stable today.  - Neb and teaching provided. - Daily controller medication(s): Singulair (montelukast) 10mg  daily EVERY DAY - Prior to physical activity: albuterol 2 puffs 10-15 minutes before physical activity (if this is a trigger) - Rescue medications: albuterol 4 puffs every 4-6 hours as needed or albuterol nebulizer one vial every 4-6 hours as needed - Changes during respiratory infections or worsening symptoms: Add on Qvar 61mcg to 2 puffs three times daily for ONE TO TWO WEEKS. - Asthma control goals:  * Full participation in all desired activities (may need albuterol before activity) * Albuterol use two time or less a week on average (not counting use with activity) * Cough interfering with sleep two time or less a month * Oral steroids no more than once a year * No hospitalizations   3. Allergic contact dermatitis - It looks like you have your cosmetics all figured out. - Continue to use these products and avoid your other triggers.   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  - Epinephrine is up to date.   5. Return in about 6 months (around 04/02/2019). This can be an in-person, a virtual Webex or a telephone follow up visit.   Please inform us of any Emergency Department visits, hospitalizations, or changes in symptoms. Call us before going to the ED for breathing or allergy symptoms since we might be able to fit you in for a sick visit. Feel free to contact us anytime with any questions, problems, or concerns.  It was a pleasure to  see you again today!  Websites that have reliable patient information: 1. American Academy of Asthma, Allergy, and Immunology: www.aaaai.org 2. Food Allergy Research and Education (FARE): foodallergy.org 3. Mothers of Asthmatics: http://www.asthmacommunitynetwork.org 4. American College of Allergy, Asthma, and Immunology: www.acaai.org  "Like" [name] on Facebook and Instagram for our latest updates!      Make sure you are registered to vote! If you have moved or changed any of your contact information, you will need to get this updated before voting!  In some cases, you MAY be able to register to vote online: [email or web address]    Voter ID laws are NOT going into effect for the General Election in November 2020! DO NOT let this stop you from exercising your right to vote!   Absentee voting is the SAFEST way to vote during the coronavirus pandemic!   Download and print an absentee ballot request form at [email or web address] or you can scan the QR code below with your smart phone:      More information on absentee ballots can be found here: [email or web address]

## 2018-10-27 ENCOUNTER — Other Ambulatory Visit: Payer: Self-pay

## 2018-10-28 ENCOUNTER — Encounter: Payer: Self-pay | Admitting: Obstetrics & Gynecology

## 2018-10-28 ENCOUNTER — Ambulatory Visit: Payer: BC Managed Care – PPO | Admitting: Obstetrics & Gynecology

## 2018-10-28 VITALS — BP 126/82 | Ht 63.0 in | Wt 218.0 lb

## 2018-10-28 DIAGNOSIS — N83202 Unspecified ovarian cyst, left side: Secondary | ICD-10-CM

## 2018-10-28 DIAGNOSIS — D259 Leiomyoma of uterus, unspecified: Secondary | ICD-10-CM | POA: Diagnosis not present

## 2018-10-28 DIAGNOSIS — N83201 Unspecified ovarian cyst, right side: Secondary | ICD-10-CM

## 2018-10-28 DIAGNOSIS — Z90711 Acquired absence of uterus with remaining cervical stump: Secondary | ICD-10-CM | POA: Diagnosis not present

## 2018-10-28 DIAGNOSIS — R102 Pelvic and perineal pain unspecified side: Secondary | ICD-10-CM

## 2018-10-28 MED ORDER — NORETHINDRONE 0.35 MG PO TABS: 1.0000 | ORAL_TABLET | Freq: Every day | ORAL | 2 refills | Status: DC

## 2018-10-28 NOTE — Progress Notes (Signed)
Hailey Lam 1970/11/28 [id number]   History:    48 y.o. [id number]  RP:  Referred for Bilateral Ovarian Cysts  HPI: S/P Supracervical Hysterectomy for Uterine Fibroids.  After surgery, didn't have vaginal bleeding until in the last 3 months, mild vaginal bleeding with pelvic pain.  Pelvic US in 06/2018 and 08/11/2018 showing Bilateral Ovarian Cysts.  Past medical history,surgical history, family history and social history were all reviewed and documented in the EPIC chart.  Gynecologic History Patient's last menstrual period was 04/18/2017. Contraception: Supracervical Hysterectomy Last Pap: Normal per patient in 2017 Last mammogram: Normal per patient in 2017 Bone Density: Never Colonoscopy: Never  Obstetric History OB History  Gravida Para Term Preterm AB Living  4 3     1 3   SAB TAB Ectopic Multiple Live Births               # Outcome Date GA Lbr Len/2nd Weight Sex Delivery Anes PTL Lv  4 AB           3 Para           2 Para           1 Para              ROS: A ROS was performed and pertinent positives and negatives are included in the history.  GENERAL: No fevers or chills. HEENT: No change in vision, no earache, sore throat or sinus congestion. NECK: No pain or stiffness. CARDIOVASCULAR: No chest pain or pressure. No palpitations. PULMONARY: No shortness of breath, cough or wheeze. GASTROINTESTINAL: No abdominal pain, nausea, vomiting or diarrhea, melena or bright red blood per rectum. GENITOURINARY: No urinary frequency, urgency, hesitancy or dysuria. MUSCULOSKELETAL: No joint or muscle pain, no back pain, no recent trauma. DERMATOLOGIC: No rash, no itching, no lesions. ENDOCRINE: No polyuria, polydipsia, no heat or cold intolerance. No recent change in weight. HEMATOLOGICAL: No anemia or easy bruising or bleeding. NEUROLOGIC: No headache, seizures, numbness, tingling or weakness. PSYCHIATRIC: No depression, no loss of interest in normal activity or change in sleep pattern.     Exam:   BP 126/82   Ht 5\' 3"  (1.6 m)   Wt 218 lb (98.9 kg)   LMP 04/18/2017   BMI 38.62 kg/m   Body mass index is 38.62 kg/m.  General appearance : Well developed well nourished female. No acute distress  Pelvic: Vulva: Normal             Vagina: No gross lesions or discharge  Cervix: Endocervical nodule visible at exocervix, c/w small Fibroid.  Uterus  Absent  Adnexa  Without masses or tenderness  Anus: Normal  Pelvic US 08/11/2018: S/P Supracervical Hysterectomy.  Left ovary with a 2.3 cm complex cyst compatible with a hemorrhagic cyst.  Large right simple ovarian cyst measured at 5.7 cm, decreased in size from June 23, 2018 at which time it was 7.7 cm.  Large amount of free fluid in the pelvis.   Assessment/Plan:  48 y.o. female for annual exam   1. Bilateral ovarian cysts No adnexal mass felt on Gyn exam today.  No severe pelvic pain.  Per Pelvic US 08/11/2018, Left ovarian cyst was stable and small, probably hemorrhagic, measured at 2.3 cm.  The Right ovarian cyst was simple and smaller at 5.7 cm.  Will start on the Progestin-only BCP to block ovulation and repeat a Pelvic US here in 4-6 weeks.  No CI to the Progestin pill.  Usage reviewed and prescription  sent to pharmacy.  Will decide on surgery at f/u with pelvic US. - US Transvaginal Non-OB; Future  2. Pelvic pain in female Start the Progestin-only BPC and f/u Pelvic US.  3. History of abdominal supracervical subtotal hysterectomy Possible cervical Fibroid. - US Transvaginal Non-OB; Future  4. Fibroid of cervix F/U Pelvic US to investigate.  Other orders - norethindrone (MICRONOR) 0.35 MG tablet; Take 1 tablet (0.35 mg total) by mouth daily.  Counseling on above issues and coordination of care >50% x 45 minutes  Princess Bruins MD, 12:45 PM 10/28/2018

## 2018-10-31 ENCOUNTER — Encounter: Payer: Self-pay | Admitting: Obstetrics & Gynecology

## 2018-10-31 ENCOUNTER — Telehealth: Payer: Self-pay

## 2018-10-31 NOTE — Telephone Encounter (Signed)
I called patient to discuss scheduling surgery. We reviewed her insurance benefits and her estimated surgery prepymt amount due by one week prior to surgery.  We discussed dates and scheduled her for 12/25/18 8:30am at [facility].  I discussed need for Covid screen prior and advised quarantine instructions. That is a holiday weekend so I held off scheduling a time until I talk with Jonelle Sidle to see if any changes in how we schedule over the holidays. I will cal her back to arrange that.  She has an u/s and visit scheduled with ML to follow up. Financial letter and packet will be mailed.

## 2018-10-31 NOTE — Patient Instructions (Signed)
1. Bilateral ovarian cysts No adnexal mass felt on Gyn exam today.  No severe pelvic pain.  Per Pelvic US 08/11/2018, Left ovarian cyst was stable and small, probably hemorrhagic, measured at 2.3 cm.  The Right ovarian cyst was simple and smaller at 5.7 cm.  Will start on the Progestin-only BCP to block ovulation and repeat a Pelvic US here in 4-6 weeks.  No CI to the Progestin pill.  Usage reviewed and prescription sent to pharmacy.  Will decide on surgery at f/u with pelvic US. - US Transvaginal Non-OB; Future  2. Pelvic pain in female Start the Progestin-only BPC and f/u Pelvic US.  3. History of abdominal supracervical subtotal hysterectomy Possible cervical Fibroid. - US Transvaginal Non-OB; Future  4. Fibroid of cervix F/U Pelvic US to investigate.  Other orders - norethindrone (MICRONOR) 0.35 MG tablet; Take 1 tablet (0.35 mg total) by mouth daily.  Hailey Lam, it was a pleasure meeting you today!

## 2018-11-18 ENCOUNTER — Telehealth: Payer: Self-pay

## 2018-11-18 NOTE — Telephone Encounter (Signed)
At least 3 weeks, some take 6 weeks.

## 2018-11-18 NOTE — Telephone Encounter (Signed)
Patient scheduled for Robotic BSO/Trachelectomy. She asked how long will she be down/out of work?  She does sedentary desk work.

## 2018-11-19 NOTE — Telephone Encounter (Signed)
Patient informed. 

## 2018-11-26 ENCOUNTER — Encounter: Payer: Self-pay | Admitting: Anesthesiology

## 2018-12-01 ENCOUNTER — Other Ambulatory Visit: Payer: Self-pay

## 2018-12-02 ENCOUNTER — Ambulatory Visit (INDEPENDENT_AMBULATORY_CARE_PROVIDER_SITE_OTHER): Payer: BC Managed Care – PPO

## 2018-12-02 ENCOUNTER — Ambulatory Visit: Payer: BC Managed Care – PPO | Admitting: Obstetrics & Gynecology

## 2018-12-02 DIAGNOSIS — N83202 Unspecified ovarian cyst, left side: Secondary | ICD-10-CM

## 2018-12-02 DIAGNOSIS — Z90711 Acquired absence of uterus with remaining cervical stump: Secondary | ICD-10-CM

## 2018-12-02 DIAGNOSIS — R102 Pelvic and perineal pain: Secondary | ICD-10-CM

## 2018-12-02 DIAGNOSIS — N83201 Unspecified ovarian cyst, right side: Secondary | ICD-10-CM | POA: Diagnosis not present

## 2018-12-02 MED ORDER — NORETHINDRONE 0.35 MG PO TABS: 1.0000 | ORAL_TABLET | Freq: Every day | ORAL | 4 refills | Status: DC

## 2018-12-02 NOTE — Progress Notes (Signed)
Hailey Lam 21-Apr-1970 [id number]        48 y.o.  [id number]   RP:  Bilateral Ovarian Cysts for Pelvic US  HPI: S/P Supracervical Hysterectomy for Uterine Fibroids.  After surgery, didn't have vaginal bleeding until in the last 3 months, mild vaginal bleeding with pelvic pain.  Pelvic US in 06/2018 and 08/11/2018 showing Bilateral Ovarian Cysts.  Started on the Progestin-only BCP on 10/28/2018 and was doing very well with resolved pelvic pain, no vaginal bleeding.  No increase in appetite on the Progestin Pill, feeling better.  But stopped it 4 days ago because was done with her pack and was not sure if should restart a new one.  Since then, has experience menstrual like cramping and pelvic discomfort.  No vaginal bleeding.    OB History  Gravida Para Term Preterm AB Living  4 3     1 3   SAB TAB Ectopic Multiple Live Births               # Outcome Date GA Lbr Len/2nd Weight Sex Delivery Anes PTL Lv  4 AB           3 Para           2 Para           1 Para             Past medical history,surgical history, problem list, medications, allergies, family history and social history were all reviewed and documented in the EPIC chart.   Directed ROS with pertinent positives and negatives documented in the history of present illness/assessment and plan.  Exam:  There were no vitals filed for this visit. General appearance:  Normal  Pelvic US today: T/V images.  Nonlatex probe cover used.  Comparison is made with previous outside scan done on August 11, 2018.  Uterus is surgically absent.  Cervix is seen in place measuring 3.9 x 2.7 x 6.4 cm.  Several nabothian cysts present.  Both ovaries normal in size with normal follicular pattern.  Left ovary with a resolving 1.5 cm corpus luteum cyst.  The ovarian cysts seen on the previous scan are not seen today.  Large loculated fluid collection in the cul-de-sac extending into bilateral adnexal regions measuring 14 x 3.2 cm.   Assessment/Plan:  48  y.o. [id number]   1. Pelvic pain in female Pelvic ultrasound findings reviewed with patient.  Both ovaries were normal in size with normal follicular pattern.  The left ovary showed a resolving 1.5 cm corpus luteum cyst.  The ovarian cysts seen on the previous scan are not seen on today's ultrasound.  Patient reassured.  2. History of abdominal supracervical subtotal hysterectomy S/P Supracervical Hysterectomy.  Very well with no pelvic pain on the Progestin pill.  Started feeling crampy when stopped 4 days ago.  Pelvic US showing a large collection of fluid in the cul-de-sac extending to the bilateral adnexal regions 14 x 3.2 cm.  Probable menstrual bleeding from lower uterine segment endometrium remaining.  Patient reassured and recommended to restart on the Progestin-only BCPs.  Will observe and repeat a Pelvic US at next Annual/Gyn visit with Dr Chevis Pretty or if recurring pain.  3. Bilateral ovarian cysts Resolved bilateral ovarian cysts on today's pelvic ultrasound.  Patient reassured.  Other orders - norethindrone (MICRONOR) 0.35 MG tablet; Take 1 tablet (0.35 mg total) by mouth daily.  Counseling on above issues and coordination of care more than 50% for 25 minutes.  Princess Bruins  MD, 11:36 AM 12/02/2018

## 2018-12-05 ENCOUNTER — Encounter: Payer: Self-pay | Admitting: Obstetrics & Gynecology

## 2018-12-05 NOTE — Patient Instructions (Signed)
1. Pelvic pain in female Pelvic ultrasound findings reviewed with patient.  Both ovaries were normal in size with normal follicular pattern.  The left ovary showed a resolving 1.5 cm corpus luteum cyst.  The ovarian cysts seen on the previous scan are not seen on today's ultrasound.  Patient reassured.  2. History of abdominal supracervical subtotal hysterectomy S/P Supracervical Hysterectomy.  Very well with no pelvic pain on the Progestin pill.  Started feeling crampy when stopped 4 days ago.  Pelvic US showing a large collection of fluid in the cul-de-sac extending to the bilateral adnexal regions 14 x 3.2 cm.  Probable menstrual bleeding from lower uterine segment endometrium remaining.  Patient reassured and recommended to restart on the Progestin-only BCPs.  Will observe and repeat a Pelvic US at next Annual/Gyn visit with Dr Chevis Pretty or if recurring pain.  3. Bilateral ovarian cysts Resolved bilateral ovarian cysts on today's pelvic ultrasound.  Patient reassured.  Other orders - norethindrone (MICRONOR) 0.35 MG tablet; Take 1 tablet (0.35 mg total) by mouth daily.  Hailey Lam, it was a pleasure seeing you today!

## 2018-12-20 ENCOUNTER — Other Ambulatory Visit (HOSPITAL_COMMUNITY): Payer: Managed Care, Other (non HMO)

## 2018-12-24 ENCOUNTER — Ambulatory Visit (HOSPITAL_BASED_OUTPATIENT_CLINIC_OR_DEPARTMENT_OTHER): Admit: 2018-12-24 | Payer: Managed Care, Other (non HMO) | Admitting: Obstetrics & Gynecology

## 2018-12-24 ENCOUNTER — Encounter (HOSPITAL_BASED_OUTPATIENT_CLINIC_OR_DEPARTMENT_OTHER): Payer: Self-pay

## 2018-12-24 SURGERY — SALPINGO-OOPHORECTOMY, BILATERAL, ROBOT-ASSISTED
Anesthesia: General

## 2019-04-03 ENCOUNTER — Encounter: Payer: Self-pay | Admitting: Allergy & Immunology

## 2019-04-03 ENCOUNTER — Ambulatory Visit (INDEPENDENT_AMBULATORY_CARE_PROVIDER_SITE_OTHER): Payer: 59 | Admitting: Allergy & Immunology

## 2019-04-03 ENCOUNTER — Other Ambulatory Visit: Payer: Self-pay

## 2019-04-03 VITALS — BP 126/72 | HR 89 | Temp 98.3°F | Resp 18

## 2019-04-03 DIAGNOSIS — L232 Allergic contact dermatitis due to cosmetics: Secondary | ICD-10-CM

## 2019-04-03 DIAGNOSIS — J3089 Other allergic rhinitis: Secondary | ICD-10-CM

## 2019-04-03 DIAGNOSIS — J4530 Mild persistent asthma, uncomplicated: Secondary | ICD-10-CM

## 2019-04-03 DIAGNOSIS — J302 Other seasonal allergic rhinitis: Secondary | ICD-10-CM

## 2019-04-03 DIAGNOSIS — T7800XD Anaphylactic reaction due to unspecified food, subsequent encounter: Secondary | ICD-10-CM

## 2019-04-03 MED ORDER — EPINEPHRINE 0.3 MG/0.3ML IJ SOAJ: 0.3000 mg | INTRAMUSCULAR | 1 refills | Status: DC | PRN

## 2019-04-03 NOTE — Patient Instructions (Addendum)
1. Seasonal and perennial allergic rhinitis (trees, weeds, grasses, dust mites and cockroach) - Continue with: Zyrtec (cetirizine) 10mg  tablet once daily  - Add on: the Astelin up to two sprays per nostril twice daily during spring - Use nasal saline rinses (NeilMed or Netti Pot) 1-2 times daily to move allergens and thin out mucous.  - Consider allergy shots as a means of long-term control.   2. Mild persistent asthma, uncomplicated - Lung function looked stable today.  - You seem to have a good grasp on your symptom.  - Daily controller medication(s): NOTHING - Prior to physical activity: albuterol 2 puffs 10-15 minutes before physical activity (if this is a trigger) - Rescue medications: albuterol 4 puffs every 4-6 hours as needed or albuterol nebulizer one vial every 4-6 hours as needed - Changes during respiratory infections or worsening symptoms: Add on Qvar 70mcg to 2 puffs three times daily and Singulair 10mg  for ONE TO TWO WEEKS. - Asthma control goals:  * Full participation in all desired activities (may need albuterol before activity) * Albuterol use two time or less a week on average (not counting use with activity) * Cough interfering with sleep two time or less a month * Oral steroids no more than once a year * No hospitalizations   3. Allergic contact dermatitis - It looks like you have your cosmetics all figured out. - Continue to use these products and avoid your other triggers.   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  - Epinephrine is up to date.   5. Return in about 6 months (around 10/04/2019). This can be an in-person, a virtual Webex or a telephone follow up visit.   Please inform us of any Emergency Department visits, hospitalizations, or changes in symptoms. Call us before going to the ED for breathing or allergy symptoms since we might be able to fit you in for a sick visit. Feel free to  contact us anytime with any questions, problems, or concerns.  It was a pleasure to see you again today!  Websites that have reliable patient information: 1. American Academy of Asthma, Allergy, and Immunology: www.aaaai.org 2. Food Allergy Research and Education (FARE): foodallergy.org 3. Mothers of Asthmatics: http://www.asthmacommunitynetwork.org 4. American College of Allergy, Asthma, and Immunology: www.acaai.org   COVID-19 Vaccine Information can be found at: ShippingScam.co.uk For questions related to vaccine distribution or appointments, please email [email or web address] or call [phone].     "Like" [name] on Facebook and Instagram for our latest updates!        Make sure you are registered to vote! If you have moved or changed any of your contact information, you will need to get this updated before voting!  In some cases, you MAY be able to register to vote online: [email or web address]

## 2019-04-03 NOTE — Progress Notes (Signed)
FOLLOW UP  Date of Service/Encounter:  04/03/19   Assessment:   Mild persistent asthma, uncomplicated  Seasonal and perennial allergic rhinitis(trees, weeds, grasses, dust mites and cockroach)  Allergic contact dermatitis(parabens, fragrance mix, tea tree)  Adversefoodreaction (strawberries, shrimp, egg, soy)  Poor compliance - secondary to preference for holistic therapies   Hailey Lam is doing very well and I had an absolute delight with her, as always. She continues to be non compliant with the medication plan we have in place, but that is to be expected knowing her personality. She seems to realize the utility of the medications and is willing to start using them when her symptoms become too much for her to handle on her own. I do not see a need to change much regarding her medication regimen, but we did make several of her medications PRN, as she was taking them in that manner anyway. There does not seem to be more of a need for any more aggressive treatments at this point in time.   Plan/Recommendations:   1. Seasonal and perennial allergic rhinitis (trees, weeds, grasses, dust mites and cockroach) - Continue with: Zyrtec (cetirizine) 10mg  tablet once daily  - Add on: the Astelin up to two sprays per nostril twice daily during spring - Use nasal saline rinses (NeilMed or Netti Pot) 1-2 times daily to move allergens and thin out mucous.  - Consider allergy shots as a means of long-term control.   2. Mild persistent asthma, uncomplicated - Lung function looked stable today.  - You seem to have a good grasp on your symptom.  - Daily controller medication(s): NOTHING - Prior to physical activity: albuterol 2 puffs 10-15 minutes before physical activity (if this is a trigger) - Rescue medications: albuterol 4 puffs every 4-6 hours as needed or albuterol nebulizer one vial every 4-6 hours as needed - Changes during respiratory infections or worsening symptoms: Add on Qvar  4mcg to 2 puffs three times daily and Singulair 10mg  for ONE TO TWO WEEKS. - Asthma control goals:  * Full participation in all desired activities (may need albuterol before activity) * Albuterol use two time or less a week on average (not counting use with activity) * Cough interfering with sleep two time or less a month * Oral steroids no more than once a year * No hospitalizations   3. Allergic contact dermatitis - It looks like you have your cosmetics all figured out. - Continue to use these products and avoid your other triggers.   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  - Epinephrine is up to date.   5. Return in about 6 months (around 10/04/2019). This can be an in-person, a virtual Webex or a telephone follow up visit.    Subjective:   Hailey Lam is a 49 y.o. female presenting today for follow up of  Chief Complaint  Patient presents with  . Asthma    Hailey Lam has a history of the following: Patient Active Problem List   Diagnosis Date Noted  . Seasonal and perennial allergic rhinitis 04/25/2017  . Mild persistent asthma without complication [id number]  . Allergic contact dermatitis 04/25/2017  . Adverse food reaction 04/25/2017  . Encounter for routine gynecological examination 08/19/2013  . Edema 04/14/2013  . Neuropathy 04/14/2013  . Atypical chest pain 04/14/2013    History obtained from: chart review and patient.  Hailey Lam is a 49 y.o. female presenting for a follow up visit.  She was last seen in September 2020.  At that time, her lung function look stable.  We continued her on Singulair 10 mg daily every day.  She does have Qvar that she adds during respiratory flares.  For her allergic rhinitis, we continue with Zyrtec as well as nasal saline rinses as needed.  She seemed to have a contact dermatitis under control with avoidance of particular triggers.  She has a history of anaphylaxis to  shrimp, strawberries, and cinnamon.  We recommended avoidance of all of her triggering foods.  Since last visit, she has mostly done well.   Asthma/Respiratory Symptom History: She started having some trouble yesterday when she was outdoors working with her father in the yard. She was afraid she had COVID, but she took all of her asthma medications and she did better in the morning. She used her nebulzier, Singulair, and Qvar and the Astelin. She felt much better this morning when she woke up.   Allergic Rhinitis Symptom History: She does take cetirizine on a daily basis, but as far as the medications that we prescribe go, this is the only one that she uses like this. She has not needed any antibiotics at all. She has been using her mask to avoid any COVID19 exposure.   Food Allergy Symptom History: She is avoiding shrimp. She is "terrified". She has had a couple of strawberries. She is avoiding cinnamon since she is nusure what it is going to do. She has accidentally eaten some and done fine.   Eczema Symptom History: Her skin is under good control. She has taken a while to figure out which cosmetics and personal care items that she can tolerate, but she seems to be doing well with this nonetheless. She has not needed any systemic steroids in quite some time.   Otherwise, there have been no changes to her past medical history, surgical history, family history, or social history.    Review of Systems  Constitutional: Negative.  Negative for chills, fever, malaise/fatigue and weight loss.  HENT: Negative.  Negative for congestion, ear discharge, ear pain and sore throat.   Eyes: Negative for pain, discharge and redness.  Respiratory: Positive for shortness of breath and wheezing. Negative for cough and sputum production.   Cardiovascular: Negative.  Negative for chest pain and palpitations.  Gastrointestinal: Negative for abdominal pain, constipation, diarrhea, heartburn, nausea and vomiting.    Skin: Negative.  Negative for itching and rash.  Neurological: Negative for dizziness and headaches.  Endo/Heme/Allergies: Negative for environmental allergies. Does not bruise/bleed easily.       Objective:   Blood pressure 126/72, pulse 89, temperature 98.3 F (36.8 C), temperature source Temporal, resp. rate 18, last menstrual period 04/18/2017, SpO2 99 %. There is no height or weight on file to calculate BMI.   Physical Exam:  Physical Exam  Constitutional: She appears well-developed.  Very pleasant female. She actually is quite well appearing and amusing as always.   HENT:  Head: Normocephalic and atraumatic.  Right Ear: Tympanic membrane, external ear and ear canal normal.  Left Ear: Tympanic membrane, external ear and ear canal normal.  Nose: Mucosal edema and rhinorrhea present. No nasal deformity or septal deviation. No epistaxis. Right sinus exhibits no maxillary sinus tenderness and no frontal sinus tenderness. Left sinus exhibits no maxillary sinus tenderness and no frontal sinus tenderness.  Mouth/Throat: Uvula is midline and oropharynx is clear and moist. Mucous membranes are not pale and not dry.  Tonsils 2+ bilaterally without exudates.  There is some cobblestoning present in the posterior oropharynx.   Eyes: Pupils are equal, round, and reactive to light. Conjunctivae and EOM are normal. Right eye exhibits no chemosis and no discharge. Left eye exhibits no chemosis and no discharge. Right conjunctiva is not injected. Left conjunctiva is not injected.  Cardiovascular: Normal rate, regular rhythm and normal heart sounds.  Respiratory: Effort normal and breath sounds normal. No accessory muscle usage. No tachypnea. No respiratory distress. She has no wheezes. She has no rhonchi. She has no rales. She exhibits no tenderness.  Moving air well in all lung fields. No increased work of breathing noted.   Lymphadenopathy:    She has no cervical adenopathy.  Neurological: She  is alert.  Skin: No abrasion, no petechiae and no rash noted. Rash is not papular, not vesicular and not urticarial. No erythema. No pallor.  Skin is clear without evidence of eczema or contact dermatitis.  Psychiatric: She has a normal mood and affect.     Diagnostic studies:    Spirometry: results normal (FEV1: 2.55/94%, FVC: 80%, FEV1/FVC: 80%).    Spirometry consistent with normal pattern.   Allergy Studies: none      Salvatore Marvel, MD  [facility]

## 2019-05-28 ENCOUNTER — Telehealth: Payer: Self-pay | Admitting: *Deleted

## 2019-05-28 DIAGNOSIS — N83201 Unspecified ovarian cyst, right side: Secondary | ICD-10-CM

## 2019-05-28 NOTE — Telephone Encounter (Signed)
Patient called requesting pelvic ultrasound due to bilateral ovarian cysts, reports the pain is starting to returned. Per note Pelvic US at next Annual/Gyn. Patient said the other GYN MD is not longer in network with her insurance, you are in network with insurance. Patient asked if ultrasound can be ordered? Please advise

## 2019-05-29 NOTE — Telephone Encounter (Signed)
Patient schedule for June 3 for ultrasound and office visit with Dr Dellis Filbert.

## 2019-05-29 NOTE — Telephone Encounter (Signed)
Agree with Pelvic US and visit with me now.

## 2019-05-29 NOTE — Telephone Encounter (Signed)
Order placed, will route to appointments to call and schedule.

## 2019-06-24 ENCOUNTER — Other Ambulatory Visit: Payer: Self-pay

## 2019-06-25 ENCOUNTER — Ambulatory Visit (INDEPENDENT_AMBULATORY_CARE_PROVIDER_SITE_OTHER): Payer: 59 | Admitting: Obstetrics & Gynecology

## 2019-06-25 ENCOUNTER — Ambulatory Visit (INDEPENDENT_AMBULATORY_CARE_PROVIDER_SITE_OTHER): Payer: 59

## 2019-06-25 DIAGNOSIS — N83201 Unspecified ovarian cyst, right side: Secondary | ICD-10-CM | POA: Diagnosis not present

## 2019-06-25 DIAGNOSIS — N83202 Unspecified ovarian cyst, left side: Secondary | ICD-10-CM

## 2019-06-25 DIAGNOSIS — Z90711 Acquired absence of uterus with remaining cervical stump: Secondary | ICD-10-CM | POA: Diagnosis not present

## 2019-06-25 DIAGNOSIS — R102 Pelvic and perineal pain: Secondary | ICD-10-CM | POA: Diagnosis not present

## 2019-06-25 DIAGNOSIS — N888 Other specified noninflammatory disorders of cervix uteri: Secondary | ICD-10-CM

## 2019-06-26 ENCOUNTER — Encounter: Payer: Self-pay | Admitting: Obstetrics & Gynecology

## 2019-06-26 NOTE — Progress Notes (Signed)
    Hailey Lam [birth year] [id number]        49 y.o.  [id number]   RP: Pelvic pain/Ovarian cysts/Pelvic fluid for f/u Pelvic US  HPI: S/P Supracervical Hysterectomy.  Well on the Progestin-only BCP.  No current pelvic pain.  No vaginal bleeding.   OB History  Gravida Para Term Preterm AB Living  4 3     1 3   SAB TAB Ectopic Multiple Live Births               # Outcome Date GA Lbr Len/2nd Weight Sex Delivery Anes PTL Lv  4 AB           3 Para           2 Para           1 Para             Past medical history,surgical history, problem list, medications, allergies, family history and social history were all reviewed and documented in the EPIC chart.   Directed ROS with pertinent positives and negatives documented in the history of present illness/assessment and plan.  Exam:  There were no vitals filed for this visit. General appearance:  Normal  Pelvic US today: Nonlatex probe cover used.  Comparison is made with previous scan in November 2020.  Uterus is surgically absent with a remaining cervix in place measured at 3.8 x 3.0 x 5.6 (TRV) centimeters.  Several nabothian cysts present.  Cervical canal within normal limit.  Both ovaries are normal in size with normal follicular pattern.  The fluid collection in the posterior cul-de-sac is greatly reduced today at 6.4 x 1.5 cm.  Nontender vaginal exam.   Assessment/Plan:  49 y.o. [id number]   1. Pelvic pain in female Resolved pelvic pain on the progestin pill.  Status post abdominal supracervical hysterectomy.  Had menses postop with probable small area of remaining endometrial tissue.  No longer having vaginal bleeding.  Pelvic ultrasound findings reviewed thoroughly with patient.  Very reassuring compared to the pelvic ultrasound in November 2020.  Normal cervical canal.  Both ovaries are normal in size with normal follicular pattern.  The fluid collection in the posterior cul-de-sac is greatly reduced to 6.4 x 1.5 cm.  Decision to  observe on the progestin pill.  2. Bilateral ovarian cysts Resolved on pelvic ultrasound today.  3. History of abdominal supracervical subtotal hysterectomy   Princess Bruins MD, 12:13 PM 06/26/2019

## 2019-06-26 NOTE — Patient Instructions (Signed)
1. Pelvic pain in female Resolved pelvic pain on the progestin pill.  Status post abdominal supracervical hysterectomy.  Had menses postop with probable small area of remaining endometrial tissue.  No longer having vaginal bleeding.  Pelvic ultrasound findings reviewed thoroughly with patient.  Very reassuring compared to the pelvic ultrasound in November 2020.  Normal cervical canal.  Both ovaries are normal in size with normal follicular pattern.  The fluid collection in the posterior cul-de-sac is greatly reduced to 6.4 x 1.5 cm.  Decision to observe on the progestin pill.  2. Bilateral ovarian cysts Resolved on pelvic ultrasound today.  3. History of abdominal supracervical subtotal hysterectomy  Madge, it was a pleasure seeing you today!

## 2019-06-30 ENCOUNTER — Telehealth: Payer: Self-pay | Admitting: *Deleted

## 2019-06-30 DIAGNOSIS — K9049 Malabsorption due to intolerance, not elsewhere classified: Secondary | ICD-10-CM

## 2019-06-30 DIAGNOSIS — Z91018 Allergy to other foods: Secondary | ICD-10-CM

## 2019-06-30 NOTE — Telephone Encounter (Signed)
Orders placed at Nutrition center at [facility] they will call to schedule.

## 2019-06-30 NOTE — Telephone Encounter (Signed)
-----   Message from Princess Bruins, MD sent at 06/25/2019 12:28 PM EDT ----- Regarding: Refer to Nutritionist at [place] allergies/intolerances with gas/loose stools and lower abdominal cramps.

## 2019-07-07 NOTE — Telephone Encounter (Signed)
Patient scheduled on 08/13/19

## 2019-07-17 ENCOUNTER — Other Ambulatory Visit: Payer: Self-pay | Admitting: Sports Medicine

## 2019-07-17 ENCOUNTER — Ambulatory Visit (INDEPENDENT_AMBULATORY_CARE_PROVIDER_SITE_OTHER): Payer: 59

## 2019-07-17 ENCOUNTER — Encounter: Payer: Self-pay | Admitting: Sports Medicine

## 2019-07-17 ENCOUNTER — Other Ambulatory Visit: Payer: Self-pay

## 2019-07-17 ENCOUNTER — Ambulatory Visit: Payer: 59 | Admitting: Sports Medicine

## 2019-07-17 DIAGNOSIS — M722 Plantar fascial fibromatosis: Secondary | ICD-10-CM | POA: Diagnosis not present

## 2019-07-17 DIAGNOSIS — M216X2 Other acquired deformities of left foot: Secondary | ICD-10-CM

## 2019-07-17 DIAGNOSIS — M79672 Pain in left foot: Secondary | ICD-10-CM

## 2019-07-17 DIAGNOSIS — M216X1 Other acquired deformities of right foot: Secondary | ICD-10-CM | POA: Diagnosis not present

## 2019-07-17 DIAGNOSIS — M779 Enthesopathy, unspecified: Secondary | ICD-10-CM

## 2019-07-17 DIAGNOSIS — M2142 Flat foot [pes planus] (acquired), left foot: Secondary | ICD-10-CM

## 2019-07-17 DIAGNOSIS — M2141 Flat foot [pes planus] (acquired), right foot: Secondary | ICD-10-CM

## 2019-07-17 MED ORDER — PREDNISONE 10 MG (21) PO TBPK: ORAL_TABLET | ORAL | 0 refills | Status: DC

## 2019-07-17 MED ORDER — DICLOFENAC SODIUM 75 MG PO TBEC: 75.0000 mg | DELAYED_RELEASE_TABLET | Freq: Two times a day (BID) | ORAL | 0 refills | Status: DC

## 2019-07-17 NOTE — Patient Instructions (Signed)

## 2019-07-17 NOTE — Progress Notes (Signed)
Subjective: Hailey Lam is a 49 y.o. female patient presents to office with complaint of moderate heel pain on the left heel at the bottom and the side for the last year reports that pain is 4 out of 10 worse in the mornings which is 10 out of 10 reports that she was seen on several occasions in 2019 and 2020 by podiatrist over in [place].  Patient reports that her pain started in 2019 when she felt a pop when she was doing a workout and every since has had pain reports that her previous podiatrist diagnosed her with heel spur and gave her a shot that helped for 3 months and then the pain came back so she went for a round of 3 other injections with the last injection in October 2020 reports that the injection that she had last did not help any and slowly over time the pain in the bottom of her heel continue to worsen and she had started to have pain later starting in April of this year on the side of the foot reports that she has tried Tylenol changing shoes insoles stretching getting fitted for new balance changing socks water bottle and icing with no relief.  Patient denies any history of other injury or trauma besides as mentioned above. Denies any other pedal complaints.   Patient admits to a family history of rheumatoid arthritis and has been previously checked and was negative.  Patient Active Problem List   Diagnosis Date Noted  . Multinodular goiter 06/20/2018  . Seasonal and perennial allergic rhinitis 04/25/2017  . Mild persistent asthma without complication 37/90/2409  . Allergic contact dermatitis 04/25/2017  . Adverse food reaction 04/25/2017  . Encounter for routine gynecological examination 08/19/2013  . Edema 04/14/2013  . Neuropathy 04/14/2013  . Atypical chest pain 04/14/2013    Current Outpatient Medications on File Prior to Visit  Medication Sig Dispense Refill  . albuterol (PROVENTIL HFA;VENTOLIN HFA) 108 (90 Base) MCG/ACT inhaler Inhale 2 puffs into the lungs every 4 (four)  hours as needed for wheezing or shortness of breath. 1 Inhaler 1  . azelastine (ASTELIN) 0.1 % nasal spray PLACE 1-2 SPRAYS INTO BOTH NOSTRILS 2 (TWO) TIMES DAILY. 90 mL 1  . beclomethasone (QVAR REDIHALER) 40 MCG/ACT inhaler Inhale 1 puff into the lungs 2 (two) times daily. 3 Inhaler 1  . Chlorphen-Phenyltolox-PE-APAP (NOREL SR PO) Take by mouth as needed.    . clobetasol (TEMOVATE) 0.05 % external solution     . DERMA-SMOOTHE/FS SCALP 0.01 % OIL     . EPINEPHrine (AUVI-Q) 0.3 mg/0.3 mL IJ SOAJ injection Inject 0.3 mLs (0.3 mg total) into the muscle as needed for anaphylaxis. 2 each 1  . montelukast (SINGULAIR) 10 MG tablet TAKE 1 TABLET BY MOUTH EVERYDAY AT BEDTIME 90 tablet 0  . Multiple Vitamins-Minerals (MULTIVITAMIN WITH MINERALS) tablet Take by mouth.    . norethindrone (MICRONOR) 0.35 MG tablet Take 1 tablet (0.35 mg total) by mouth daily. 3 Package 4  . [DISCONTINUED] Cetirizine HCl (ZYRTEC PO) Take by mouth as needed.     No current facility-administered medications on file prior to visit.    Allergies  Allergen Reactions  . Minocycline Swelling  . Tea Tree Oil Swelling, Itching and Rash    Reaction to to tea tree oil, Facial swelling Reaction to to tea tree oil, Facial swelling   . Other   . Penicillins Other (See Comments)    Causes yeast infection  . Sulfa Antibiotics   . Sulfamethoxazole  Swelling    Objective: Physical Exam General: The patient is alert and oriented x3 in no acute distress.  Dermatology: Skin is warm, dry and supple bilateral lower extremities. Nails 1-10 are normal. There is no erythema, edema, no eccymosis, no open lesions present. Integument is otherwise unremarkable.  Vascular: Dorsalis Pedis pulse and Posterior Tibial pulse are 2/4 bilateral. Capillary fill time is immediate to all digits.  Neurological: Grossly intact to light touch with an achilles reflex of +2/5 and a  negative Tinel's sign bilateral.  Musculoskeletal: Tenderness to  palpation at the medial calcaneal tubercale and through the insertion of the plantar fascia on the left foot.  Tenderness to palpation along the peroneal tendon course on the lateral side of the left foot.  No pain with compression of calcaneus bilateral. No pain with tuning fork to calcaneus bilateral. No pain with calf compression bilateral. There is decreased Ankle joint range of motion bilateral. All other joints range of motion within normal limits bilateral. Strength 5/5 in all groups bilateral.  Pes planus foot type.  Gait: Unassisted, Antalgic avoid weight on left heel  Xray,Left foot:  Normal osseous mineralization. Joint spaces preserved except at midfoot where there is breech supportive of pes planus. No fracture/dislocation/boney destruction. Calcaneal spur present with mild thickening of plantar fascia. No other soft tissue abnormalities or radiopaque foreign bodies.   Assessment and Plan: Problem List Items Addressed This Visit    None    Visit Diagnoses    Plantar fasciitis of left foot    -  Primary   Tendonitis       Left foot pain       Acquired equinus deformity of both feet       Pes planus of both feet          -Complete examination performed.  -Xrays reviewed -Discussed with patient in detail the condition of plantar fasciitis and tendinitis, how this occurs and general treatment options. Explained both conservative and surgical treatments.  -No reinjection given at this time since patient has had approximately 4 injections in the past without any relief -Rx Diclofenac to start after Medrol dose pack is completed -Recommended good supportive shoes - Explained in detail the use of the fascial brace for the left which was dispensed at today's visit. -Explained and dispensed to patient daily stretching exercises. -Recommend patient to ice affected area 1-2x daily. -Patient to return to office call office if pain or symptoms is not better after 2 weeks for me to proceed  with ordering a MRI since pain has been chronic since 2019.  Landis Martins, DPM

## 2019-07-22 ENCOUNTER — Other Ambulatory Visit: Payer: Self-pay | Admitting: Sports Medicine

## 2019-07-22 DIAGNOSIS — M722 Plantar fascial fibromatosis: Secondary | ICD-10-CM

## 2019-08-13 ENCOUNTER — Other Ambulatory Visit: Payer: Self-pay

## 2019-08-13 ENCOUNTER — Encounter: Payer: 59 | Attending: Obstetrics & Gynecology | Admitting: Skilled Nursing Facility1

## 2019-08-13 ENCOUNTER — Encounter: Payer: Self-pay | Admitting: Skilled Nursing Facility1

## 2019-08-13 DIAGNOSIS — K9049 Malabsorption due to intolerance, not elsewhere classified: Secondary | ICD-10-CM | POA: Diagnosis present

## 2019-08-13 NOTE — Progress Notes (Signed)
  Assessment:  Primary concerns today: GI distress.   Pt states she is allergic to shrimp, -but continues to eat them brand specific.  cinnomon, egg, soy: allergies with emesis completely avoids. States she gets joint pain with dairy so avoids.   Pt state she suffers from cysts on her ovaries so thought pain in her abdomen was from that feeling like it is menstrual cramps. Pt states she is always gassy. Pt states when she eats red meat she is physically uncomfortable. Pt states all day every day she feels bloated and gassy. Pt states her weight fluctuates 10 pounds per week. Pt states she feels like she retains a lot of fluid.  Pt states she does not feel she voids her bladder completely.  Pt states she has reflux: with popcorn.  Pt states she always has intermittent constipation and diarrhea.  Pt states she always has to have something to chew.  Pt states she does not eat lunch so she is too hungry to control portions the next time she eats.  Pt states she did not realize she was stress eating due to anxiety.    MEDICATIONS: see list   DIETARY INTAKE:  Usual eating pattern includes 1-2 meals and 3+ snacks per day.  Everyday foods include popcorn.  Avoided foods include none stated.    24-hr recall:  B ( AM):  Snk ( AM): L ( PM): popcorn + nutritional yeast + tex[place] pete powder  Snk ( PM):  D ( PM):  Snk ( PM):  Beverages: sweet tea, unsweet tea, diet juice, water, hot tea, coffee  Usual physical activity: ADL's due to foot pain  Estimated energy needs: 1500 calories   Progress Towards Goal(s):  In progress.    Intervention:  Nutrition counseling. Dietitian educated pt on balanced eating patterns within the context of GI distress.   Goals: Keep a log of all foods and drinks you are putting into your mouth throughout the day: also write all of your symptoms: reflux, bloating, chest burning, constipation, diarrhea   Probiotic: 10^8 CFU's or more, refrigerated, and 8 different  strains or more Try limiting yeast for 4 days and see if there is any difference  Work on portion sizes by eating until satisfaction not physical fullness  Ask your doctor about a referral to a pelvic floor PT  Teaching Method Utilized:  Visual Auditory Hands on  Handouts given during visit include:  Detailed MyPlate  Mindful meals check list  Barriers to learning/adherence to lifestyle change: anxious eating  Demonstrated degree of understanding via:  Teach Back   Monitoring/Evaluation:  Dietary intake, exercise, and body weight prn.

## 2019-08-27 ENCOUNTER — Ambulatory Visit: Payer: 59 | Admitting: Sports Medicine

## 2019-09-17 ENCOUNTER — Encounter: Payer: 59 | Attending: Obstetrics & Gynecology | Admitting: Skilled Nursing Facility1

## 2019-09-17 ENCOUNTER — Other Ambulatory Visit: Payer: Self-pay

## 2019-09-17 DIAGNOSIS — K9049 Malabsorption due to intolerance, not elsewhere classified: Secondary | ICD-10-CM

## 2019-09-17 NOTE — Progress Notes (Signed)
  Assessment:  Primary concerns today: GI distress.   Pt states she is allergic to shrimp, -but continues to eat them brand specific.  cinnomon, egg, soy: allergies with emesis completely avoids. States she gets joint pain with dairy so avoids.   Pt arrives with abdominal distention with it being taught having eaten egg and bacon later realizing she had cayenne pepper on it (this did cause reflux).  Pt states she kept a journal of her foods. Pt states she measured her foods for the first 2 weeks but then was not able to keep the behavior up. Pt states she found there are days ahe only ate one meal and cannot believe it because she loves food stating she think sit is because she felt full after that meal all day long. Pt states she is unsure if it is bloat or not.  Pt states she has started taking a probiotic pill (has fructooligosaccarides and inulin in it) which feels it has helped but did have diarrhea feeling relief and states she was able to eat normally after that and states she felt really great.  Pt states she typically has constipation feeling she needs to go but cannot.   Daily symptoms: gas, reflux, and bloat sometimes constipation  Suspect FODMAP issue  MEDICATIONS: see list   DIETARY INTAKE:  Usual eating pattern includes 1-2 meals and 3+ snacks per day.  Everyday foods include popcorn.  Avoided foods include none stated.    24-hr recall:  B ( AM): 2 cups watermelon and 4 grapes  Snk ( AM): 6 slices zucchini   Spring mix 4 olives dried cranberries/bluberries 1/4 cup cucumber 4 slcies tomato 9 mild banna peppers 8 dill pickles plant based ranch olive oil peppers garlic salt  L ( PM):  D ( PM): baked potato ms dash plant based butter garlic powder pepper salt  Snk ( PM):  Beverages: diet cran grape, coffee, water   Usual physical activity: ADL's due to foot pain  Estimated energy needs: 1500 calories   Progress Towards Goal(s):  In progress.    Intervention:  Nutrition  counseling. Dietitian educated pt on balanced eating patterns within the context of GI distress.   Goals: Avoid for 3 days:  Garlic  Onion  Watermelon  Beans  probioctic   Raw green leafys   Add in metamucil after the 3 days and if you are constipated   Ask your doctor for a referral to a GI specialist   Teaching Method Utilized:  Visual Auditory Hands on  Handouts given during visit include:  Detailed MyPlate  Mindful meals check list  Barriers to learning/adherence to lifestyle change: anxious eating  Demonstrated degree of understanding via:  Teach Back   Monitoring/Evaluation:  Dietary intake, exercise, and body weight prn.

## 2019-10-09 ENCOUNTER — Ambulatory Visit: Payer: 59 | Admitting: Allergy & Immunology

## 2019-10-30 ENCOUNTER — Encounter: Payer: Self-pay | Admitting: Obstetrics & Gynecology

## 2019-10-30 ENCOUNTER — Other Ambulatory Visit: Payer: Self-pay

## 2019-10-30 ENCOUNTER — Ambulatory Visit (INDEPENDENT_AMBULATORY_CARE_PROVIDER_SITE_OTHER): Payer: 59 | Admitting: Obstetrics & Gynecology

## 2019-10-30 VITALS — BP 128/80 | Ht 63.0 in | Wt 221.0 lb

## 2019-10-30 DIAGNOSIS — E6609 Other obesity due to excess calories: Secondary | ICD-10-CM | POA: Diagnosis not present

## 2019-10-30 DIAGNOSIS — Z6839 Body mass index (BMI) 39.0-39.9, adult: Secondary | ICD-10-CM

## 2019-10-30 DIAGNOSIS — Z90711 Acquired absence of uterus with remaining cervical stump: Secondary | ICD-10-CM | POA: Diagnosis not present

## 2019-10-30 DIAGNOSIS — Z01419 Encounter for gynecological examination (general) (routine) without abnormal findings: Secondary | ICD-10-CM

## 2019-10-30 DIAGNOSIS — Z8742 Personal history of other diseases of the female genital tract: Secondary | ICD-10-CM | POA: Diagnosis not present

## 2019-10-30 MED ORDER — NORETHINDRONE 0.35 MG PO TABS: 1.0000 | ORAL_TABLET | Freq: Every day | ORAL | 4 refills | Status: DC

## 2019-10-30 NOTE — Progress Notes (Signed)
Blayre Papania Jul 18, 1970 [id number]   History:    49 y.o. [id number] Married  RP:  Established patient presenting for annual gyn exam   HPI: S/P Supracervical Hysterectomy.  Well on the Progestin-only BCP.  No current pelvic pain.  No vaginal bleeding.  Had a vaginal d/c x a couple days with pelvic pain early September, resolved since then.  BMI 39.15.  Possible food intolerances, occasionally feeling bloated, will investigate/manage with Fam MD.  Health labs with Fam MD.   Past medical history,surgical history, family history and social history were all reviewed and documented in the EPIC chart.  Gynecologic History Patient's last menstrual period was 04/18/2017.  Obstetric History OB History  Gravida Para Term Preterm AB Living  4 3     1 3   SAB TAB Ectopic Multiple Live Births               # Outcome Date GA Lbr Len/2nd Weight Sex Delivery Anes PTL Lv  4 AB           3 Para           2 Para           1 Para              ROS: A ROS was performed and pertinent positives and negatives are included in the history.  GENERAL: No fevers or chills. HEENT: No change in vision, no earache, sore throat or sinus congestion. NECK: No pain or stiffness. CARDIOVASCULAR: No chest pain or pressure. No palpitations. PULMONARY: No shortness of breath, cough or wheeze. GASTROINTESTINAL: No abdominal pain, nausea, vomiting or diarrhea, melena or bright red blood per rectum. GENITOURINARY: No urinary frequency, urgency, hesitancy or dysuria. MUSCULOSKELETAL: No joint or muscle pain, no back pain, no recent trauma. DERMATOLOGIC: No rash, no itching, no lesions. ENDOCRINE: No polyuria, polydipsia, no heat or cold intolerance. No recent change in weight. HEMATOLOGICAL: No anemia or easy bruising or bleeding. NEUROLOGIC: No headache, seizures, numbness, tingling or weakness. PSYCHIATRIC: No depression, no loss of interest in normal activity or change in sleep pattern.     Exam:   BP 128/80 (BP Location:  Right Arm, Patient Position: Sitting, Cuff Size: Large)   Ht 5\' 3"  (1.6 m)   Wt 221 lb (100.2 kg)   LMP 04/18/2017   BMI 39.15 kg/m   Body mass index is 39.15 kg/m.  General appearance : Well developed well nourished female. No acute distress HEENT: Eyes: no retinal hemorrhage or exudates,  Neck supple, trachea midline, no carotid bruits, no thyroidmegaly Lungs: Clear to auscultation, no rhonchi or wheezes, or rib retractions  Heart: Regular rate and rhythm, no murmurs or gallops Breast:Examined in sitting and supine position were symmetrical in appearance, no palpable masses or tenderness,  no skin retraction, no nipple inversion, no nipple discharge, no skin discoloration, no axillary or supraclavicular lymphadenopathy Abdomen: no palpable masses or tenderness, no rebound or guarding Extremities: no edema or skin discoloration or tenderness  Pelvic: Vulva: Normal             Vagina: No gross lesions or discharge  Cervix: No gross lesions or discharge.  Pap reflex done.  Uterus  absent  Adnexa  Without masses or tenderness  Anus: Normal  Pelvic US 06/2019: Nonlatex probe cover used.  Comparison is made with previous scan in November 2020.  Uterus is surgically absent with a remaining cervix in place measured at 3.8 x 3.0 x 5.6 (TRV) centimeters.  Several  nabothian cysts present.  Cervical canal within normal limit.  Both ovaries are normal in size with normal follicular pattern.  The fluid collection in the posterior cul-de-sac is greatly reduced today at 6.4 x 1.5 cm.  Nontender vaginal exam.   Assessment/Plan:  49 y.o. female for annual exam   1. Encounter for routine gynecological examination with Papanicolaou smear of cervix Gynecologic exam s/p Supracervical Hysterectomy.  Pap reflex done.  Breast exam normal.  Screenining Mammo to schedule asap, overdue.  Health labs with Fam MD.  Will address GI issues.  2. History of abdominal supracervical subtotal hysterectomy  3. H/O  ovulatory pain Well on the Progestin-only pill.  No CI to continue.  Represcribed.  4. Class 2 obesity due to excess calories without serious comorbidity with body mass index (BMI) of 39.0 to 39.9 in adult Recommend a lower calorie/carb diet.  Aerobic activities 5 times a week with light weight lifting every 2 days.  Continue Yoga.    Other orders - norethindrone (MICRONOR) 0.35 MG tablet; Take 1 tablet (0.35 mg total) by mouth daily.  Princess Bruins MD, 10:15 AM 10/30/2019

## 2019-10-30 NOTE — Addendum Note (Signed)
Addended by: Lorine Bears on: 10/30/2019 10:50 AM   Modules accepted: Orders

## 2019-11-05 LAB — PAP IG W/ RFLX HPV ASCU

## 2019-11-20 ENCOUNTER — Ambulatory Visit: Payer: 59 | Admitting: Allergy & Immunology

## 2019-11-20 ENCOUNTER — Other Ambulatory Visit: Payer: Self-pay

## 2019-11-20 ENCOUNTER — Encounter: Payer: Self-pay | Admitting: Allergy & Immunology

## 2019-11-20 VITALS — BP 108/70 | HR 98 | Resp 18 | Ht 63.0 in | Wt 221.0 lb

## 2019-11-20 DIAGNOSIS — L232 Allergic contact dermatitis due to cosmetics: Secondary | ICD-10-CM | POA: Diagnosis not present

## 2019-11-20 DIAGNOSIS — K9049 Malabsorption due to intolerance, not elsewhere classified: Secondary | ICD-10-CM

## 2019-11-20 DIAGNOSIS — J4530 Mild persistent asthma, uncomplicated: Secondary | ICD-10-CM

## 2019-11-20 DIAGNOSIS — J302 Other seasonal allergic rhinitis: Secondary | ICD-10-CM

## 2019-11-20 DIAGNOSIS — J3089 Other allergic rhinitis: Secondary | ICD-10-CM | POA: Diagnosis not present

## 2019-11-20 NOTE — Patient Instructions (Addendum)
1. Seasonal and perennial allergic rhinitis (trees, weeds, grasses, dust mites and cockroach) - Continue with: Zyrtec (cetirizine) 10mg  tablet once daily  - Add on: the Astelin up to two sprays per nostril twice daily AS NEEDED during spring - Use nasal saline rinses (NeilMed or Netti Pot) 1-2 times daily to move allergens and thin out mucous.  - Consider allergy shots as a means of long-term control.   2. Mild persistent asthma, uncomplicated - Lung function looked stable today.  - I think our plan is working WELL!  - Daily controller medication(s): NOTHING - Prior to physical activity: albuterol 2 puffs 10-15 minutes before physical activity (if this is a trigger) - Rescue medications: albuterol 4 puffs every 4-6 hours as needed or albuterol nebulizer one vial every 4-6 hours as needed - Changes during respiratory infections or worsening symptoms: Add on Qvar 90mcg to 2 puffs three times daily and Singulair 10mg  for ONE TO TWO WEEKS. - Asthma control goals:  * Full participation in all desired activities (may need albuterol before activity) * Albuterol use two time or less a week on average (not counting use with activity) * Cough interfering with sleep two time or less a month * Oral steroids no more than once a year * No hospitalizations   3. Allergic contact dermatitis - controlled - It looks like you have your cosmetics all figured out. - Continue to use these products and avoid your other triggers.   4. 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  - Epinephrine is up to date.   5. Return in about 6 months (around 05/20/2020).   Please inform us of any Emergency Department visits, hospitalizations, or changes in symptoms. Call us before going to the ED for breathing or allergy symptoms since we might be able to fit you in for a sick visit. Feel free to contact us anytime with any questions, problems, or concerns.  It was a pleasure to  see you again today!  Websites that have reliable patient information: 1. American Academy of Asthma, Allergy, and Immunology: www.aaaai.org 2. Food Allergy Research and Education (FARE): foodallergy.org 3. Mothers of Asthmatics: http://www.asthmacommunitynetwork.org 4. American College of Allergy, Asthma, and Immunology: www.acaai.org   COVID-19 Vaccine Information can be found at: [email or web address].[email or web address].uk For questions related to vaccine distribution or appointments, please email [email or web address] or call [phone].     "Like" [name] on Facebook and Instagram for our latest updates!     HAPPY FALL!     Make sure you are registered to vote! If you have moved or changed any of your contact information, you will need to get this updated before voting!  In some cases, you MAY be able to register to vote online: [email or web address]

## 2019-11-20 NOTE — Progress Notes (Signed)
FOLLOW UP  Date of Service/Encounter:  11/20/19   Assessment:   Mild persistent asthma, uncomplicated  Seasonal and perennial allergic rhinitis(trees, weeds, grasses, dust mites and cockroach)  Allergic contact dermatitis(parabens, fragrance mix, tea tree)  Adversefoodreaction (strawberries, shrimp, egg, soy)  Poor compliance - secondary to preference for holistic therapies  Plan/Recommendations:   1. Seasonal and perennial allergic rhinitis (trees, weeds, grasses, dust mites and cockroach) - Continue with: Zyrtec (cetirizine) 10mg  tablet once daily  - Add on: the Astelin up to two sprays per nostril twice daily AS NEEDED during spring - Use nasal saline rinses (NeilMed or Netti Pot) 1-2 times daily to move allergens and thin out mucous.  - Consider allergy shots as a means of long-term control.   2. Mild persistent asthma, uncomplicated - Lung function looked stable today.  - I think our plan is working WELL!  - Daily controller medication(s): NOTHING - Prior to physical activity: albuterol 2 puffs 10-15 minutes before physical activity (if this is a trigger) - Rescue medications: albuterol 4 puffs every 4-6 hours as needed or albuterol nebulizer one vial every 4-6 hours as needed - Changes during respiratory infections or worsening symptoms: Add on Qvar 78mcg to 2 puffs three times daily and Singulair 10mg  for ONE TO TWO WEEKS. - Asthma control goals:  * Full participation in all desired activities (may need albuterol before activity) * Albuterol use two time or less a week on average (not counting use with activity) * Cough interfering with sleep two time or less a month * Oral steroids no more than once a year * No hospitalizations   3. Allergic contact dermatitis - controlled - It looks like you have your cosmetics all figured out. - Continue to use these products and avoid your other triggers.   4. 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  - Epinephrine is up to date.   5. Return in about 6 months (around 05/20/2020).   Subjective:   Hailey Lam is a 49 y.o. female presenting today for follow up of  Chief Complaint  Patient presents with  . Asthma    some issues breathing today. change of season does get her.     Hailey Lam has a history of the following: Patient Active Problem List   Diagnosis Date Noted  . Multinodular goiter 06/20/2018  . Seasonal and perennial allergic rhinitis 04/25/2017  . Mild persistent asthma without complication 40/98/1191  . Allergic contact dermatitis 04/25/2017  . Adverse food reaction 04/25/2017  . Encounter for routine gynecological examination 08/19/2013  . Edema 04/14/2013  . Neuropathy 04/14/2013  . Atypical chest pain 04/14/2013    History obtained from: chart review and patient.  Hailey Lam is a 49 y.o. female presenting for a follow up visit.  He was last seen in March 2021.  At that time, we continue with Zyrtec daily and Astelin as needed for environmental allergies.  Her lung function looked great.  We continued with albuterol as needed with Qvar and Singulair added during respiratory flares.  For her contact dermatitis, she had already figured out all of her cosmetics that she was reacting to.  She continued avoiding shrimp, strawberries, cinnamon, and egg.  Since last visit, she has done very well.  Asthma/Respiratory Symptom History: She reports good asthma control. She did get a air puririfer for her bedroom event through they got rid of the carpeting in the home. This has helped a lot to decrease  her breathing issues.   Allergic Rhinitis Symptom History: She has been feeling the ragweed outdoors. Overall she thinks that she is doing well. She has only had to use her rescue inhaler. The air purifier has helped to improve her symptoms. She wakes up with gunk in her nose. She blows her nose and she is fine for the rest of the night. This is not  EVERY morning but it all depends. She has not needed antibiotics at all since the last visit.   Food Allergy Symptom History: She continues to avoid strawberries as well as shrimp, egg, and soy. She is eating shrimp without a problem. She is avoiding that one brand that caused her to have issues. She is tolerating strawberries. She thinks that she has eaten soy. She continues to avoid egg. Testing was negative to these in April 2019.  She states she might need a gastroenterology referral.  She Dr. Darl Lam care provider, who requested visit before she refers her to GI.  Contact Dermatitis Symptom History: She has got her cosmetics all figured out.  She thinks that the testing did help her to clarify a lot of things.   She was on prednisone for her foot. Otherwise she has not needed systemic steroids.  She has been very busy.  She works part-time as a Careers information officer.  She also has started working 3 days a week in [employer] office.  Evidently, the rest of staff is quit and she just keeps getting more more work piled on her.  Otherwise, there have been no changes to her past medical history, surgical history, family history, or social history.    Review of Systems  Constitutional: Negative.  Negative for chills, fever, malaise/fatigue and weight loss.  HENT: Negative for congestion, ear discharge, ear pain, sinus pain and sore throat.   Eyes: Negative for pain, discharge and redness.  Respiratory: Negative for cough, sputum production, shortness of breath and wheezing.   Cardiovascular: Negative.  Negative for chest pain and palpitations.  Gastrointestinal: Negative for abdominal pain, constipation, diarrhea, heartburn, nausea and vomiting.  Skin: Positive for rash. Negative for itching.  Neurological: Negative for dizziness and headaches.  Endo/Heme/Allergies: Positive for environmental allergies. Does not bruise/bleed easily.       Objective:   Blood pressure 108/70, pulse 98, resp. rate 18, height  5\' 3"  (1.6 m), weight 221 lb (100.2 kg), last menstrual period 04/18/2017, SpO2 98 %. Body mass index is 39.15 kg/m.   Physical Exam:  Physical Exam Constitutional:      Appearance: She is well-developed.     Comments: Very pleasant bubbly female.  HENT:     Head: Normocephalic and atraumatic.     Right Ear: Tympanic membrane, ear canal and external ear normal.     Left Ear: Tympanic membrane, ear canal and external ear normal.     Nose: No nasal deformity, septal deviation, mucosal edema or rhinorrhea.     Right Turbinates: Enlarged and swollen.     Left Turbinates: Enlarged and swollen.     Right Sinus: No maxillary sinus tenderness or frontal sinus tenderness.     Left Sinus: No maxillary sinus tenderness or frontal sinus tenderness.     Comments: Clear rhinorrhea bilaterally.    Mouth/Throat:     Mouth: Mucous membranes are not pale and not dry.     Pharynx: Uvula midline.  Eyes:     General:        Right eye: No discharge.  Left eye: No discharge.     Conjunctiva/sclera: Conjunctivae normal.     Right eye: Right conjunctiva is not injected. No chemosis.    Left eye: Left conjunctiva is not injected. No chemosis.    Pupils: Pupils are equal, round, and reactive to light.  Cardiovascular:     Rate and Rhythm: Normal rate and regular rhythm.     Heart sounds: Normal heart sounds.  Pulmonary:     Effort: Pulmonary effort is normal. No tachypnea, accessory muscle usage or respiratory distress.     Breath sounds: Normal breath sounds. No wheezing, rhonchi or rales.     Comments: Moving air well in all lung fields.  No increased work of breathing. Chest:     Chest wall: No tenderness.  Lymphadenopathy:     Cervical: No cervical adenopathy.  Skin:    Coloration: Skin is not pale.     Findings: No abrasion, erythema, petechiae or rash. Rash is not papular, urticarial or vesicular.     Comments: No eczematous or urticarial lesions noted.  Her skin looks quite clear  today.  Neurological:     Mental Status: She is alert.  Psychiatric:        Behavior: Behavior is cooperative.      Diagnostic studies:    Spirometry: results normal (FEV1: 2.01/88%, FVC: 2.53/89%, FEV1/FVC: 79%).    Spirometry consistent with normal pattern.   Allergy Studies: none       Salvatore Marvel, MD  [facility] [place] [facility]

## 2019-11-23 ENCOUNTER — Encounter (INDEPENDENT_AMBULATORY_CARE_PROVIDER_SITE_OTHER): Payer: Self-pay | Admitting: *Deleted

## 2019-11-30 ENCOUNTER — Other Ambulatory Visit: Payer: Self-pay | Admitting: Obstetrics & Gynecology

## 2019-11-30 DIAGNOSIS — Z1231 Encounter for screening mammogram for malignant neoplasm of breast: Secondary | ICD-10-CM

## 2019-12-11 ENCOUNTER — Other Ambulatory Visit: Payer: Self-pay

## 2019-12-11 ENCOUNTER — Ambulatory Visit (INDEPENDENT_AMBULATORY_CARE_PROVIDER_SITE_OTHER): Payer: 59

## 2019-12-11 DIAGNOSIS — Z23 Encounter for immunization: Secondary | ICD-10-CM | POA: Diagnosis not present

## 2019-12-11 NOTE — Progress Notes (Signed)
   Covid-19 Vaccination Clinic  Name:  Hailey Lam    MRN: [id number] DOB: [birth year]  12/11/2019  Hailey Lam was observed post Covid-19 immunization for 30 minutes based on pre-vaccination screening without incident. She was provided with Vaccine Information Sheet and instruction to access the V-Safe system.   Hailey Lam was instructed to call 911 with any severe reactions post vaccine: Marland Kitchen Difficulty breathing  . Swelling of face and throat  . A fast heartbeat  . A bad rash all over body  . Dizziness and weakness

## 2019-12-23 ENCOUNTER — Ambulatory Visit (INDEPENDENT_AMBULATORY_CARE_PROVIDER_SITE_OTHER): Payer: Managed Care, Other (non HMO) | Admitting: Gastroenterology

## 2020-01-28 ENCOUNTER — Ambulatory Visit
Admission: RE | Admit: 2020-01-28 | Discharge: 2020-01-28 | Disposition: A | Discharge status: Home / Self Care | Payer: Managed Care, Other (non HMO) | Source: Ambulatory Visit | Attending: Obstetrics & Gynecology | Admitting: Obstetrics & Gynecology

## 2020-01-28 ENCOUNTER — Other Ambulatory Visit: Payer: Self-pay

## 2020-01-28 DIAGNOSIS — Z1231 Encounter for screening mammogram for malignant neoplasm of breast: Secondary | ICD-10-CM

## 2020-02-04 ENCOUNTER — Other Ambulatory Visit: Payer: Self-pay

## 2020-02-04 ENCOUNTER — Encounter (INDEPENDENT_AMBULATORY_CARE_PROVIDER_SITE_OTHER): Payer: Self-pay | Admitting: Gastroenterology

## 2020-02-04 ENCOUNTER — Telehealth (INDEPENDENT_AMBULATORY_CARE_PROVIDER_SITE_OTHER): Payer: 59 | Admitting: Gastroenterology

## 2020-02-04 DIAGNOSIS — K589 Irritable bowel syndrome without diarrhea: Secondary | ICD-10-CM

## 2020-02-04 DIAGNOSIS — R140 Abdominal distension (gaseous): Secondary | ICD-10-CM | POA: Diagnosis not present

## 2020-02-04 DIAGNOSIS — R109 Unspecified abdominal pain: Secondary | ICD-10-CM | POA: Insufficient documentation

## 2020-02-04 DIAGNOSIS — R1030 Lower abdominal pain, unspecified: Secondary | ICD-10-CM

## 2020-02-04 MED ORDER — HYOSCYAMINE SULFATE 0.125 MG SL SUBL: 0.1250 mg | SUBLINGUAL_TABLET | Freq: Four times a day (QID) | SUBLINGUAL | 2 refills | Status: DC | PRN

## 2020-02-04 NOTE — Progress Notes (Signed)
Hailey Lam, M.D. Gastroenterology & Hepatology [facility] [address] [place], [place] [address] Primary Care Physician: Leeanne Rio, MD [address] [place] [address]  Referring MD: PCP  This is a telephone virtual visit.  It required patient-provider interaction for the medical decision making as documented below. The patient has consented and agreed to proceed with a Telehealth encounter given the current Coronavirus pandemic.  VIRTUAL VISIT NOTE Patient location: Home Provider location: Office  I will communicate my assessment and recommendations to the referring MD via EMR. Referring MD: PCP  Chief Complaint: Bloating, diarrhea and abdominal pain  History of Present Illness: Hailey Lam is a 50 y.o. female with PMH asthma, who presents for evaluation of bloating, diarrhea and abdominal pain.  Patient reports that since 2019 she has had "digestive issues", she believes this happened after undergoing a hysterectomy.  The patient reports for multiple years (almost all her life) she had recurrent episodes of bloating after eating.  There was nonspecific type of food that led to the symptoms.  She did not have symptoms while sleeping.  However, since her surgery, the patient reports intermittent episodes of lower abdominal pain whenever she eats, usually when she eats broccoli or vegetables.  Has not noticed any changes in the frequency of her bloating. She believes the pain goes away when she has a BM. The BMs may have soft to normal consistency, usually soft when she eats vegetables. She usually has 2 BMs, usually normal in consistency. The patient has noticed she may have diarrhea when eating diary, for which she has tried to avoid eating these products frequently. Has nausea occasionally in the past.  Notably, the patient took probiotics in the past which caused diarrhea.  Regarding her diet, she has tried a plant  based diet with worsening of her symptoms. She saw a nutritionist in the past who recommended a process of elimination of food to determine possible food allergies. She reports that did not have any improvement.  Due to this, she is now eating without restrictions.  The patient was seen by her gynecologist and an allergist who recommended an evaluation by gastroenterology as they consider she may have some food allergies.  She denies having any itching, or edema when consuming any specific type of food.  Her gynecologist has also been concerned as he has seen 2 times some fluid in the cul-de-sac while performing transvaginal ultrasound -the patient was told that this could be related to leaky gut.  The patient denies having any vomiting, fever, chills, hematochezia, melena, hematemesis, jaundice, pruritus or weight loss.  Last BVQ:XIHWT Last Colonoscopy:never  FHx: neg for any gastrointestinal/liver disease, no malignancies Social: neg smoking, alcohol or illicit drug use Surgical:hysterectomy  Past Medical History: Past Medical History:  Diagnosis Date      . Asthma   . Chest pain   . Dizziness   . Family history of diabetes mellitus   . Family history of hypertension   . Heart murmur   . History of palpitations   . Recurrent upper respiratory infection (URI)   . Seasonal allergies     Past Surgical History: Past Surgical History:  Procedure Laterality Date  . Abdominal Ablation  05/1999  . ABDOMINAL HYSTERECTOMY  07/01/2017  . DOBUTAMINE STRESS ECHO  05/31/2009   exercised to stage 3 of bruce protocol - normal BP response, no ischemia by EKG & echo criteria  . TRANSTHORACIC ECHOCARDIOGRAM  07/2011  EF=>55%, LV hyperdynamic; MV leaflets appear thickened & mild MR; mild TR  . TUBAL LIGATION  10/2005  . WISDOM TOOTH EXTRACTION      Family History: Family History  Problem Relation Age of Onset  . Pneumonia Maternal Grandmother   . Asthma Mother   . Diabetes Mother   .  Hypertension Mother   . Allergic rhinitis Mother        Allergic to "soy milk"  . Diabetes Father   . Hypertension Father   . Eczema Son   . Psoriasis Son   . Heart disease Maternal Uncle   . Heart attack Maternal Uncle        3 heart attacks  . Eczema Son   . Asthma Son   . Eczema Son     Social History: Social History   Tobacco Use  Smoking Status Never Smoker  Smokeless Tobacco Never Used   Social History   Substance and Sexual Activity  Alcohol Use No   Social History   Substance and Sexual Activity  Drug Use No    Allergies: Allergies  Allergen Reactions  . Minocycline Swelling  . Tea Tree Oil Swelling, Itching and Rash    Reaction to to tea tree oil, Facial swelling Reaction to to tea tree oil, Facial swelling   . Other   . Penicillins Other (See Comments)    Causes yeast infection  . Sulfa Antibiotics   . Sulfamethoxazole Swelling    Medications: Current Outpatient Medications  Medication Sig Dispense Refill  . albuterol (PROVENTIL HFA;VENTOLIN HFA) 108 (90 Base) MCG/ACT inhaler Inhale 2 puffs into the lungs every 4 (four) hours as needed for wheezing or shortness of breath. 1 Inhaler 1  . azelastine (ASTELIN) 0.1 % nasal spray PLACE 1-2 SPRAYS INTO BOTH NOSTRILS 2 (TWO) TIMES DAILY. 90 mL 1  . beclomethasone (QVAR REDIHALER) 40 MCG/ACT inhaler Inhale 1 puff into the lungs 2 (two) times daily. 3 Inhaler 1  . cetirizine (ZYRTEC) 10 MG chewable tablet Chew 10 mg by mouth daily.    . Chlorphen-Phenyltolox-PE-APAP (NOREL SR PO) Take by mouth as needed.    . clobetasol (TEMOVATE) 0.05 % external solution     . DERMA-SMOOTHE/FS SCALP 0.01 % OIL     . EPINEPHrine (AUVI-Q) 0.3 mg/0.3 mL IJ SOAJ injection Inject 0.3 mLs (0.3 mg total) into the muscle as needed for anaphylaxis. 2 each 1  . montelukast (SINGULAIR) 10 MG tablet TAKE 1 TABLET BY MOUTH EVERYDAY AT BEDTIME 90 tablet 0  . Multiple Vitamins-Minerals (MULTIVITAMIN WITH MINERALS) tablet Take by mouth.     . norethindrone (MICRONOR) 0.35 MG tablet Take 1 tablet (0.35 mg total) by mouth daily. 84 tablet 4  . diclofenac (VOLTAREN) 75 MG EC tablet Take 1 tablet (75 mg total) by mouth 2 (two) times daily. (Patient not taking: Reported on 02/04/2020) 30 tablet 0   No current facility-administered medications for this visit.    Review of Systems: GENERAL: negative for malaise, night sweats HEENT: No changes in hearing or vision, no nose bleeds or other nasal problems. NECK: Negative for lumps, goiter, pain and significant neck swelling RESPIRATORY: Negative for cough, wheezing CARDIOVASCULAR: Negative for chest pain, leg swelling, palpitations, orthopnea GI: SEE HPI MUSCULOSKELETAL: Negative for joint pain or swelling, back pain, and muscle pain. SKIN: Negative for lesions, rash PSYCH: Negative for sleep disturbance, mood disorder and recent psychosocial stressors. HEMATOLOGY Negative for prolonged bleeding, bruising easily, and swollen nodes. ENDOCRINE: Negative for cold or heat intolerance, polyuria, polydipsia  and goiter. NEURO: negative for tremor, gait imbalance, syncope and seizures. The remainder of the review of systems is noncontributory.   Physical Exam: No exam was performed as this was a telephone encounter  Imaging/Labs: as above  I personally reviewed and interpreted the available labs, imaging and endoscopic files.  Impression and Plan: Elilah Schollmeyer is a 50 y.o. female with PMH asthma, who presents for evaluation of bloating, diarrhea and abdominal pain.  The patient has had chronic symptoms of bloating of unclear etiology for multiple years.  However, most recently she developed abdominal pain that improved after having bowel movements.  She has not presented any red flag signs and given the presentation, she is meeting criteria for irritable bowel syndrome.  I explained this to the patient thoroughly who understood.  I explained to her that she does not seem to have any food  allergies as she has not presented any pruritus or respiratory symptoms or edema with food consumption.  I have explained to her that the diagnosis of leaky gut is questionable as there is little evidence in the medical literature regarding this diagnosis.  She may benefit from implementing a low FODMAP diet for now and using Levsin as needed.  In terms of her diet, she may have some lactose intolerance but she would not like to pursue any testing for this, she was advised to avoid lactose containing products but if she wants to consume them she can take lactase supplement.  Given her symptomatology, she will need to have celiac serology testing as this can explain part of her symptoms, as well as alpha gal testing.  Given the presence of free fluid in the cul-de-sac, we will perform a CT of the abdomen to evaluate the presence of ascites or any other intra-abdominal pathology causing her symptoms.  Finally, the patient will be scheduled for colonoscopy for screening purposes as she has never had one in the past.  - Schedule CT abdomen/pelvis with IV contrast - Schedule colonoscopy - Check TTG IgA and alpha gal - Avoid lactose, can try lactase tablets if willing to eat lactose products - Explained presumed etiology of IBS symptoms. Patient was counseled about the benefit of implementing a low FODMAP to improve symptoms and recurrent episodes. A dietary list was provided to the patient. Also, the patient was counseled about the benefit of avoiding stressing situations and potential environmental triggers leading to symptomatology. - Can take Levsin as needed for abdominal pain.  All questions were answered.      Total visit time: I spent a total of  30 minutes  Hailey Peppers, MD Gastroenterology and Hepatology [facility]

## 2020-02-04 NOTE — Patient Instructions (Signed)
Schedule CT abdomen/pelvis with IV contrast Schedule colonoscopy Perform blood workup Avoid lactose, can try lactase tablets if willing to eat lactose products Explained presumed etiology of IBS symptoms. Patient was counseled about the benefit of implementing a low FODMAP to improve symptoms and recurrent episodes. A dietary list was provided to the patient. Also, the patient was counseled about the benefit of avoiding stressing situations and potential environmental triggers leading to symptomatology. Can take Levsin as needed for abdominal pain.

## 2020-02-23 ENCOUNTER — Telehealth (INDEPENDENT_AMBULATORY_CARE_PROVIDER_SITE_OTHER): Payer: Self-pay

## 2020-02-23 ENCOUNTER — Other Ambulatory Visit (INDEPENDENT_AMBULATORY_CARE_PROVIDER_SITE_OTHER): Payer: Self-pay

## 2020-02-23 DIAGNOSIS — Z1211 Encounter for screening for malignant neoplasm of colon: Secondary | ICD-10-CM

## 2020-02-23 MED ORDER — PEG 3350-KCL-NA BICARB-NACL 420 G PO SOLR: 4000.0000 mL | ORAL | 0 refills | Status: DC

## 2020-02-23 NOTE — Telephone Encounter (Signed)
Hailey Lam, CMA  

## 2020-02-24 ENCOUNTER — Encounter (INDEPENDENT_AMBULATORY_CARE_PROVIDER_SITE_OTHER): Payer: Self-pay

## 2020-03-08 NOTE — Patient Instructions (Signed)
Hailey Lam  03/08/2020     @PREFPERIOPPHARMACY @   Your procedure is scheduled on  03/11/2020.   Report to [facility] at  1130  A.M.   Call this number if you have problems the morning of surgery:  [phone]     Remember:  Follow the diet and prep instructions given to you by the office.                       Take these medicines the morning of surgery with A SIP OF WATER  Zyrtec.      Use your inhalers before you come and bring your rescue inhaler with you.    Please brush your teeth.  Do not wear jewelry, make-up or nail polish.  Do not wear lotions, powders, or perfumes, or deodorant.  Do not shave 48 hours prior to surgery.  Men may shave face and neck.  Do not bring valuables to the hospital.  [facility] is not responsible for any belongings or valuables.   Contacts, dentures or bridgework may not be worn into surgery.  Leave your suitcase in the car.  After surgery it may be brought to your room.  For patients admitted to the hospital, discharge time will be determined by your treatment team.  Patients discharged the day of surgery will not be allowed to drive home and must have somone with them for 24 hours.     Special instructions:  DO NOT smoke tobacco or vape the morning of your procedure.   Please read over the following fact sheets that you were given. Anesthesia Post-op Instructions and Care and Recovery After Surgery       Colonoscopy, Adult, Care After This sheet gives you information about how to care for yourself after your procedure. Your health care provider may also give you more specific instructions. If you have problems or questions, contact your health care provider. What can I expect after the procedure? After the procedure, it is common to have:  A small amount of blood in your stool for 24 hours after the procedure.  Some gas.  Mild cramping or bloating of your abdomen. Follow these instructions at home: Eating and  drinking  Drink enough fluid to keep your urine pale yellow.  Follow instructions from your health care provider about eating or drinking restrictions.  Resume your normal diet as instructed by your health care provider. Avoid heavy or fried foods that are hard to digest.   Activity  Rest as told by your health care provider.  Avoid sitting for a long time without moving. Get up to take short walks every 1-2 hours. This is important to improve blood flow and breathing. Ask for help if you feel weak or unsteady.  Return to your normal activities as told by your health care provider. Ask your health care provider what activities are safe for you. Managing cramping and bloating  Try walking around when you have cramps or feel bloated.  Apply heat to your abdomen as told by your health care provider. Use the heat source that your health care provider recommends, such as a moist heat pack or a heating pad. ? Place a towel between your skin and the heat source. ? Leave the heat on for 20-30 minutes. ? Remove the heat if your skin turns bright red. This is especially important if you are unable to feel pain, heat, or cold. You may have a greater  risk of getting burned.   General instructions  If you were given a sedative during the procedure, it can affect you for several hours. Do not drive or operate machinery until your health care provider says that it is safe.  For the first 24 hours after the procedure: ? Do not sign important documents. ? Do not drink alcohol. ? Do your regular daily activities at a slower pace than normal. ? Eat soft foods that are easy to digest.  Take over-the-counter and prescription medicines only as told by your health care provider.  Keep all follow-up visits as told by your health care provider. This is important. Contact a health care provider if:  You have blood in your stool 2-3 days after the procedure. Get help right away if you have:  More than a  small spotting of blood in your stool.  Large blood clots in your stool.  Swelling of your abdomen.  Nausea or vomiting.  A fever.  Increasing pain in your abdomen that is not relieved with medicine. Summary  After the procedure, it is common to have a small amount of blood in your stool. You may also have mild cramping and bloating of your abdomen.  If you were given a sedative during the procedure, it can affect you for several hours. Do not drive or operate machinery until your health care provider says that it is safe.  Get help right away if you have a lot of blood in your stool, nausea or vomiting, a fever, or increased pain in your abdomen. This information is not intended to replace advice given to you by your health care provider. Make sure you discuss any questions you have with your health care provider. Document Revised: 01/02/2019 Document Reviewed: 08/04/2018 Elsevier Patient Education  2021 [place] After This sheet gives you information about how to care for yourself after your procedure. Your health care provider may also give you more specific instructions. If you have problems or questions, contact your health care provider. What can I expect after the procedure? After the procedure, it is common to have:  Tiredness.  Forgetfulness about what happened after the procedure.  Impaired judgment for important decisions.  Nausea or vomiting.  Some difficulty with balance. Follow these instructions at home: For the time period you were told by your health care provider:  Rest as needed.  Do not participate in activities where you could fall or become injured.  Do not drive or use machinery.  Do not drink alcohol.  Do not take sleeping pills or medicines that cause drowsiness.  Do not make important decisions or sign legal documents.  Do not take care of children on your own.      Eating and drinking  Follow the  diet that is recommended by your health care provider.  Drink enough fluid to keep your urine pale yellow.  If you vomit: ? Drink water, juice, or soup when you can drink without vomiting. ? Make sure you have little or no nausea before eating solid foods. General instructions  Have a responsible adult stay with you for the time you are told. It is important to have someone help care for you until you are awake and alert.  Take over-the-counter and prescription medicines only as told by your health care provider.  If you have sleep apnea, surgery and certain medicines can increase your risk for breathing problems. Follow instructions from your health care provider about wearing your  sleep device: ? Anytime you are sleeping, including during daytime naps. ? While taking prescription pain medicines, sleeping medicines, or medicines that make you drowsy.  Avoid smoking.  Keep all follow-up visits as told by your health care provider. This is important. Contact a health care provider if:  You keep feeling nauseous or you keep vomiting.  You feel light-headed.  You are still sleepy or having trouble with balance after 24 hours.  You develop a rash.  You have a fever.  You have redness or swelling around the IV site. Get help right away if:  You have trouble breathing.  You have new-onset confusion at home. Summary  For several hours after your procedure, you may feel tired. You may also be forgetful and have poor judgment.  Have a responsible adult stay with you for the time you are told. It is important to have someone help care for you until you are awake and alert.  Rest as told. Do not drive or operate machinery. Do not drink alcohol or take sleeping pills.  Get help right away if you have trouble breathing, or if you suddenly become confused. This information is not intended to replace advice given to you by your health care provider. Make sure you discuss any questions  you have with your health care provider. Document Revised: 09/24/2019 Document Reviewed: 12/11/2018 Elsevier Patient Education  2021 [employer].

## 2020-03-09 ENCOUNTER — Encounter (HOSPITAL_COMMUNITY)
Admission: RE | Admit: 2020-03-09 | Discharge: 2020-03-09 | Disposition: A | Discharge status: Home / Self Care | Payer: 59 | Source: Ambulatory Visit | Attending: Gastroenterology | Admitting: Gastroenterology

## 2020-03-09 ENCOUNTER — Other Ambulatory Visit (HOSPITAL_COMMUNITY)
Admission: RE | Admit: 2020-03-09 | Discharge: 2020-03-09 | Disposition: A | Discharge status: Home / Self Care | Payer: 59 | Source: Ambulatory Visit | Attending: Gastroenterology | Admitting: Gastroenterology

## 2020-03-09 ENCOUNTER — Other Ambulatory Visit: Payer: Self-pay

## 2020-03-09 ENCOUNTER — Encounter (HOSPITAL_COMMUNITY): Payer: Self-pay

## 2020-03-09 DIAGNOSIS — Z20822 Contact with and (suspected) exposure to covid-19: Secondary | ICD-10-CM | POA: Diagnosis not present

## 2020-03-09 DIAGNOSIS — Z01818 Encounter for other preprocedural examination: Secondary | ICD-10-CM | POA: Diagnosis present

## 2020-03-09 HISTORY — DX: Nonrheumatic mitral (valve) insufficiency: I34.0

## 2020-03-10 LAB — IGA: IgA: 334 mg/dL (ref 87–352)

## 2020-03-10 LAB — SARS CORONAVIRUS 2 (TAT 6-24 HRS): SARS Coronavirus 2: NEGATIVE

## 2020-03-10 LAB — TISSUE TRANSGLUTAMINASE, IGA: Tissue Transglutaminase Ab, IgA: <2 U/mL (ref 0–3)

## 2020-03-11 ENCOUNTER — Ambulatory Visit (HOSPITAL_COMMUNITY): Payer: 59 | Admitting: Anesthesiology

## 2020-03-11 ENCOUNTER — Encounter (HOSPITAL_COMMUNITY)
Admission: RE | Disposition: A | Discharge status: Home / Self Care | Payer: Self-pay | Source: Home / Self Care | Attending: Gastroenterology

## 2020-03-11 ENCOUNTER — Ambulatory Visit (HOSPITAL_COMMUNITY)
Admission: RE | Admit: 2020-03-11 | Discharge: 2020-03-11 | Disposition: A | Discharge status: Home / Self Care | Payer: 59 | Attending: Gastroenterology | Admitting: Gastroenterology

## 2020-03-11 ENCOUNTER — Other Ambulatory Visit: Payer: Self-pay

## 2020-03-11 ENCOUNTER — Encounter (HOSPITAL_COMMUNITY): Payer: Self-pay | Admitting: Gastroenterology

## 2020-03-11 DIAGNOSIS — Z1211 Encounter for screening for malignant neoplasm of colon: Secondary | ICD-10-CM | POA: Diagnosis not present

## 2020-03-11 DIAGNOSIS — D123 Benign neoplasm of transverse colon: Secondary | ICD-10-CM | POA: Diagnosis not present

## 2020-03-11 HISTORY — PX: COLONOSCOPY WITH PROPOFOL: SHX5780

## 2020-03-11 HISTORY — DX: Other complications of anesthesia, initial encounter: T88.59XA

## 2020-03-11 HISTORY — PX: POLYPECTOMY: SHX149

## 2020-03-11 LAB — HM COLONOSCOPY

## 2020-03-11 SURGERY — COLONOSCOPY WITH PROPOFOL
Anesthesia: General

## 2020-03-11 MED ORDER — PROPOFOL 500 MG/50ML IV EMUL
INTRAVENOUS | Status: DC | PRN
  Administered 2020-03-11: 150 ug/kg/min via INTRAVENOUS

## 2020-03-11 MED ORDER — PROPOFOL 10 MG/ML IV BOLUS
INTRAVENOUS | Status: DC | PRN
  Administered 2020-03-11: 30 mg via INTRAVENOUS
  Administered 2020-03-11: 100 mg via INTRAVENOUS

## 2020-03-11 MED ORDER — LACTATED RINGERS IV SOLN
INTRAVENOUS | Status: DC
  Administered 2020-03-11: 1000 mL via INTRAVENOUS

## 2020-03-11 MED ORDER — STERILE WATER FOR IRRIGATION IR SOLN
Status: DC | PRN
  Administered 2020-03-11: 200 mL

## 2020-03-11 NOTE — H&P (Signed)
Hailey Lam is an 50 y.o. female.   Chief Complaint: Colorectal cancer screening HPI: Hailey Lam is a 50 y.o. female with PMH asthma and IBS-M, who presents for colorectal cancer screening.  The patient has never had a colonoscopy in the past.  The patient denies having any complaints such as melena, hematochezia, no changes in her weight recently.  She reports having some intermittent bloating episodes with intake of lactose and intermittent episodes of constipation diarrhea related to her IBS.  No family history of colorectal cancer.   Past Medical History:  Diagnosis Date  . Angio-edema   . Asthma   . Chest pain   . Complication of anesthesia    PONV  . Dizziness   . Family history of diabetes mellitus   . Family history of hypertension   . Heart murmur   . History of palpitations   . Mitral valve regurgitation   . Recurrent upper respiratory infection (URI)   . Seasonal allergies     Past Surgical History:  Procedure Laterality Date  . Abdominal Ablation  05/1999  . ABDOMINAL HYSTERECTOMY  07/01/2017  . DOBUTAMINE STRESS ECHO  05/31/2009   exercised to stage 3 of bruce protocol - normal BP response, no ischemia by EKG & echo criteria  . TRANSTHORACIC ECHOCARDIOGRAM  07/2011   EF=>55%, LV hyperdynamic; MV leaflets appear thickened & mild MR; mild TR  . TUBAL LIGATION  10/2005  . WISDOM TOOTH EXTRACTION      Family History  Problem Relation Age of Onset  . Pneumonia Maternal Grandmother   . Asthma Mother   . Diabetes Mother   . Hypertension Mother   . Allergic rhinitis Mother        Allergic to "soy milk"  . Diabetes Father   . Hypertension Father   . Eczema Son   . Psoriasis Son   . Heart disease Maternal Uncle   . Heart attack Maternal Uncle        3 heart attacks  . Eczema Son   . Asthma Son   . Eczema Son    Social History:  reports that she has never smoked. She has never used smokeless tobacco. She reports that she does not drink alcohol and does not use  drugs.  Allergies:  Allergies  Allergen Reactions  . Minocycline Swelling  . Tea Tree Oil Swelling, Itching and Rash    Reaction to to tea tree oil, Facial swelling Reaction to to tea tree oil, Facial swelling   . Other   . Penicillins Other (See Comments)    Causes yeast infection  . Sulfa Antibiotics   . Sulfamethoxazole Swelling    Medications Prior to Admission  Medication Sig Dispense Refill  . albuterol (PROVENTIL HFA;VENTOLIN HFA) 108 (90 Base) MCG/ACT inhaler Inhale 2 puffs into the lungs every 4 (four) hours as needed for wheezing or shortness of breath. 1 Inhaler 1  . cetirizine (ZYRTEC) 10 MG chewable tablet Chew 10 mg by mouth daily.    Marland Kitchen EPINEPHrine (AUVI-Q) 0.3 mg/0.3 mL IJ SOAJ injection Inject 0.3 mLs (0.3 mg total) into the muscle as needed for anaphylaxis. 2 each 1  . Multiple Vitamins-Minerals (MULTIVITAMIN WITH MINERALS) tablet Take 1 tablet by mouth daily.    . norethindrone (MICRONOR) 0.35 MG tablet Take 1 tablet (0.35 mg total) by mouth daily. 84 tablet 4  . azelastine (ASTELIN) 0.1 % nasal spray PLACE 1-2 SPRAYS INTO BOTH NOSTRILS 2 (TWO) TIMES DAILY. (Patient taking differently: Place 1-2 sprays into both  nostrils 2 (two) times daily as needed for allergies.) 90 mL 1  . beclomethasone (QVAR REDIHALER) 40 MCG/ACT inhaler Inhale 1 puff into the lungs 2 (two) times daily. (Patient taking differently: Inhale 1 puff into the lungs 2 (two) times daily as needed (asthma).) 3 Inhaler 1  . Chlorphen-Phenyltolox-PE-APAP (NOREL SR PO) Take 1 tablet by mouth daily as needed (allergies).    . clobetasol (TEMOVATE) 0.05 % external solution Apply 1 application topically 2 (two) times daily as needed (irritation).    . DERMA-SMOOTHE/FS SCALP 0.01 % OIL Apply 1 application topically daily as needed (irritation).    . hyoscyamine (LEVSIN SL) 0.125 MG SL tablet Place 1 tablet (0.125 mg total) under the tongue every 6 (six) hours as needed for cramping. (Patient not taking: Reported  on 03/02/2020) 30 tablet 2  . montelukast (SINGULAIR) 10 MG tablet TAKE 1 TABLET BY MOUTH EVERYDAY AT BEDTIME (Patient taking differently: Take 10 mg by mouth at bedtime as needed (asthma).) 90 tablet 0  . polyethylene glycol-electrolytes (TRILYTE) 420 g solution Take 4,000 mLs by mouth as directed. (Patient not taking: Reported on 03/02/2020) 4000 mL 0    Results for orders placed or performed during the hospital encounter of 03/09/20 (from the past 48 hour(s))  IgA     Status: None   Collection Time: 03/09/20  1:08 PM  Result Value Ref Range   IgA 334 87 - 352 mg/dL    Comment: (NOTE) Performed At: [facility] [place], [place] [id number] Rush Farmer MD DU:[id number]   Tissue transglutaminase, IgA     Status: None   Collection Time: 03/09/20  1:08 PM  Result Value Ref Range   Tissue Transglutaminase Ab, IgA <2 0 - 3 U/mL    Comment: (NOTE)                              Negative        0 -  3                              Weak Positive   4 - 10                              Positive           >10 Tissue Transglutaminase (tTG) has been identified as the endomysial antigen.  Studies have demonstr- ated that endomysial IgA antibodies have over 99% specificity for gluten sensitive enteropathy. Performed At: [facility] [place], [place] [id number] Rush Farmer MD VO:[id number]   SARS CORONAVIRUS 2 (TAT 6-24 HRS) Nasopharyngeal Nasopharyngeal Swab     Status: None   Collection Time: 03/09/20  1:08 PM   Specimen: Nasopharyngeal Swab  Result Value Ref Range   SARS Coronavirus 2 NEGATIVE NEGATIVE    Comment: (NOTE) SARS-CoV-2 target nucleic acids are NOT DETECTED.  The SARS-CoV-2 RNA is generally detectable in upper and lower respiratory specimens during the acute phase of infection. Negative results do not preclude SARS-CoV-2 infection, do not rule out co-infections with other pathogens, and should not be used as the sole basis for  treatment or other patient management decisions. Negative results must be combined with clinical observations, patient history, and epidemiological information. The expected result is Negative.  Fact Sheet for Patients: SugarRoll.be  Fact Sheet for Healthcare Providers: https://www.woods-mathews.com/  This test is not yet approved or cleared by the [place] and  has been authorized for detection and/or diagnosis of SARS-CoV-2 by FDA under an Emergency Use Authorization (EUA). This EUA will remain  in effect (meaning this test can be used) for the duration of the COVID-19 declaration under Se ction 564(b)(1) of the Act, 21 U.S.C. section 360bbb-3(b)(1), unless the authorization is terminated or revoked sooner.  Performed at [facility], [place] [address] [place],  [address]    No results found.  Review of Systems  Constitutional: Negative.   HENT: Negative.   Eyes: Negative.   Respiratory: Negative.   Cardiovascular: Negative.   Gastrointestinal: Positive for abdominal distention, constipation and diarrhea.  Endocrine: Negative.   Genitourinary: Negative.   Musculoskeletal: Negative.   Skin: Negative.   Allergic/Immunologic: Negative.   Neurological: Negative.   Hematological: Negative.   Psychiatric/Behavioral: Negative.     Blood pressure 131/86, pulse 94, resp. rate 17, last menstrual period 04/18/2017, SpO2 100 %. Physical Exam  GENERAL: The patient is AO x3, in no acute distress. HEENT: Head is normocephalic and atraumatic. EOMI are intact. Mouth is well hydrated and without lesions. NECK: Supple. No masses LUNGS: Clear to auscultation. No presence of rhonchi/wheezing/rales. Adequate chest expansion HEART: RRR, normal s1 and s2. ABDOMEN: Soft, nontender, no guarding, no peritoneal signs, and nondistended. BS +. No masses. EXTREMITIES: Without any cyanosis, clubbing, rash, lesions or edema. NEUROLOGIC:  AOx3, no focal motor deficit. SKIN: no jaundice, no rashes  Assessment/Plan Rochella Cancel is a 50 y.o. female with PMH asthma and IBS-M, who presents for colorectal cancer screening. The patient is at average risk for colorectal cancer.  We will proceed with colonoscopy today.   Harvel Quale, MD 03/11/2020, 12:12 PM

## 2020-03-11 NOTE — Discharge Instructions (Signed)
You are being discharged to home.  Resume your previous diet.  We are waiting for your pathology results.  Your physician has recommended a repeat colonoscopy for surveillance based on pathology results.  

## 2020-03-11 NOTE — Anesthesia Preprocedure Evaluation (Addendum)
Anesthesia Evaluation  Patient identified by MRN, date of birth, ID band Patient awake    Reviewed: Allergy & Precautions, NPO status , Patient's Chart, lab work & pertinent test results  History of Anesthesia Complications (+) history of anesthetic complications  Airway Mallampati: II  TM Distance: >3 FB Neck ROM: Full    Dental  (+) Dental Advisory Given, Chipped,    Pulmonary asthma , Recent URI ,    Pulmonary exam normal breath sounds clear to auscultation       Cardiovascular Exercise Tolerance: Good Normal cardiovascular exam+ Valvular Problems/Murmurs MR  Rhythm:Regular Rate:Normal     Neuro/Psych  Neuromuscular disease negative psych ROS   GI/Hepatic negative GI ROS, Neg liver ROS,   Endo/Other  negative endocrine ROS  Renal/GU negative Renal ROS     Musculoskeletal negative musculoskeletal ROS (+)   Abdominal   Peds  Hematology negative hematology ROS (+)   Anesthesia Other Findings   Reproductive/Obstetrics negative OB ROS                            Anesthesia Physical Anesthesia Plan  ASA: II  Anesthesia Plan: General   Post-op Pain Management:    Induction: Intravenous  PONV Risk Score and Plan: TIVA  Airway Management Planned: Nasal Cannula and Natural Airway  Additional Equipment:   Intra-op Plan:   Post-operative Plan:   Informed Consent: I have reviewed the patients History and Physical, chart, labs and discussed the procedure including the risks, benefits and alternatives for the proposed anesthesia with the patient or authorized representative who has indicated his/her understanding and acceptance.     Dental advisory given  Plan Discussed with: CRNA and Surgeon  Anesthesia Plan Comments:         Anesthesia Quick Evaluation

## 2020-03-11 NOTE — Op Note (Addendum)
[facility] Patient Name: Hailey Lam Procedure Date: 03/11/2020 11:53 AM MRN: [id number] Date of Birth: 02-13-1970 Attending MD: [name] ,  CSN: [id number] Age: 50 Admit Type: Outpatient Procedure:                Colonoscopy Indications:              Screening for colorectal malignant neoplasm Providers:                [name], Crystal Page, Aram Candela Referring MD:              Medicines:                Monitored Anesthesia Care Complications:            No immediate complications. Estimated Blood Loss:     Estimated blood loss: none. Procedure:                Pre-Anesthesia Assessment:                           - Prior to the procedure, a History and Physical                            was performed, and patient medications, allergies                            and sensitivities were reviewed. The patient's                            tolerance of previous anesthesia was reviewed.                           - The risks and benefits of the procedure and the                            sedation options and risks were discussed with the                            patient. All questions were answered and informed                            consent was obtained.                           - ASA Grade Assessment: II - A patient with mild                            systemic disease.                           After obtaining informed consent, the colonoscope                            was passed under direct vision. Throughout the                            procedure, the patient's blood pressure, pulse,  and                            oxygen saturations were monitored continuously. The                            [id number]-[id number] ([id number]) scope was introduced through                            the anus and advanced to the the terminal ileum.                            The colonoscopy was performed without difficulty.                            The patient tolerated the  procedure well. Scope In: 12:29:14 PM Scope Out: 12:52:27 PM Scope Withdrawal Time: 0 hours 16 minutes 47 seconds  Total Procedure Duration: 0 hours 23 minutes 13 seconds  Findings:      The perianal and digital rectal examinations were normal.      The terminal ileum appeared normal.      A 2 mm polyp was found in the transverse colon. The polyp was sessile.       The polyp was removed with a cold biopsy forceps. Resection and       retrieval were complete.      The retroflexed view of the distal rectum and anal verge was normal and       showed no anal or rectal abnormalities. Impression:               - The examined portion of the ileum was normal.                           - One 2 mm polyp in the transverse colon, removed                            with a cold biopsy forceps. Resected and retrieved.                           - The distal rectum and anal verge are normal on                            retroflexion view. Moderate Sedation:      Per Anesthesia Care Recommendation:           - Discharge patient to home (ambulatory).                           - Resume previous diet.                           - Await pathology results.                           - Repeat colonoscopy for surveillance based on  pathology results. Procedure Code(s):        --- Professional ---                           [phone], Colonoscopy, flexible; with biopsy, single                            or multiple Diagnosis Code(s):        --- Professional ---                           Z12.11, Encounter for screening for malignant                            neoplasm of colon                           K63.5, Polyp of colon CPT copyright 2019 American Medical Association. All rights reserved. The codes documented in this report are preliminary and upon coder review may  be revised to meet current compliance requirements. [name], MD [name],  03/11/2020 1:02:52 PM This  report has been signed electronically. Number of Addenda: 0

## 2020-03-11 NOTE — Anesthesia Postprocedure Evaluation (Signed)
Anesthesia Post Note  Patient: Hailey Lam  Procedure(s) Performed: COLONOSCOPY WITH PROPOFOL (N/A ) POLYPECTOMY INTESTINAL  Patient location during evaluation: Phase II Anesthesia Type: General Level of consciousness: awake and alert and oriented Pain management: satisfactory to patient Vital Signs Assessment: post-procedure vital signs reviewed and stable Respiratory status: spontaneous breathing and respiratory function stable Cardiovascular status: blood pressure returned to baseline and stable Postop Assessment: no apparent nausea or vomiting Anesthetic complications: no   No complications documented.   Last Vitals:  Vitals:   03/11/20 1210 03/11/20 1214  BP: 131/86 131/86  Pulse: 94 92  Resp: 17 16  Temp:  37.1 C  SpO2: 100% 100%    Last Pain:  Vitals:   03/11/20 1225  PainSc: 0-No pain                 Karna Dupes

## 2020-03-11 NOTE — Transfer of Care (Signed)
Immediate Anesthesia Transfer of Care Note  Patient: Hailey Lam  Procedure(s) Performed: COLONOSCOPY WITH PROPOFOL (N/A ) POLYPECTOMY INTESTINAL  Patient Location: PACU  Anesthesia Type:General  Level of Consciousness: awake, alert  and oriented  Airway & Oxygen Therapy: Patient Spontanous Breathing  Post-op Assessment: Report given to RN and Post -op Vital signs reviewed and stable  Post vital signs: Reviewed and stable  Last Vitals:  Vitals Value Taken Time  BP    Temp    Pulse    Resp    SpO2      Last Pain:  Vitals:   03/11/20 1225  PainSc: 0-No pain      Patients Stated Pain Goal: 10 (03/11/20 1214)  Complications: No complications documented.

## 2020-03-15 LAB — SURGICAL PATHOLOGY

## 2020-03-16 ENCOUNTER — Encounter (HOSPITAL_COMMUNITY): Payer: Self-pay | Admitting: Gastroenterology

## 2020-03-16 LAB — ALPHA GALACTOSIDASE: Alpha galactosidase, serum: 58.2 nmol/hr/mg prt (ref >35.5)

## 2020-03-22 ENCOUNTER — Encounter (INDEPENDENT_AMBULATORY_CARE_PROVIDER_SITE_OTHER): Payer: Self-pay | Admitting: *Deleted

## 2020-04-07 ENCOUNTER — Ambulatory Visit (HOSPITAL_COMMUNITY)
Admission: RE | Admit: 2020-04-07 | Discharge: 2020-04-07 | Disposition: A | Discharge status: Home / Self Care | Payer: 59 | Source: Ambulatory Visit | Attending: Gastroenterology | Admitting: Gastroenterology

## 2020-04-07 DIAGNOSIS — R140 Abdominal distension (gaseous): Secondary | ICD-10-CM | POA: Insufficient documentation

## 2020-04-07 DIAGNOSIS — R1030 Lower abdominal pain, unspecified: Secondary | ICD-10-CM | POA: Diagnosis present

## 2020-04-07 MED ORDER — IOHEXOL 300 MG/ML  SOLN
100.0000 mL | Freq: Once | INTRAMUSCULAR | Status: AC | PRN
  Administered 2020-04-07: 100 mL via INTRAVENOUS

## 2020-05-05 ENCOUNTER — Ambulatory Visit (INDEPENDENT_AMBULATORY_CARE_PROVIDER_SITE_OTHER): Payer: 59 | Admitting: Gastroenterology

## 2020-05-05 ENCOUNTER — Other Ambulatory Visit: Payer: Self-pay

## 2020-05-05 ENCOUNTER — Encounter (INDEPENDENT_AMBULATORY_CARE_PROVIDER_SITE_OTHER): Payer: Self-pay | Admitting: Gastroenterology

## 2020-05-05 VITALS — BP 110/75 | HR 73 | Temp 98.9°F | Ht 63.5 in | Wt 217.0 lb

## 2020-05-05 DIAGNOSIS — K589 Irritable bowel syndrome without diarrhea: Secondary | ICD-10-CM

## 2020-05-05 NOTE — Patient Instructions (Signed)
Follow with dietitian regarding tailoring your diet

## 2020-05-05 NOTE — Progress Notes (Signed)
[name], M.D. Gastroenterology & Hepatology [facility] [address] [place], [place] [address]  Primary Care Physician: Leeanne Rio, MD [place]  [id number]  I will communicate my assessment and recommendations to the referring MD via EMR.  Problems: 1. IBS  History of Present Illness: Hailey Lam is a 50 y.o. female with PMH asthma and IBS, who presents for follow up of abdominal pain and diarrhea.  The patient was last seen on 02/04/2020. At that time, the patient was ordered to have CT abdomen pelvis with IV contrast which was normal (performed on).  She was advised to avoid lactose-based foods and to follow a low FODMAP diet as well as take Levsin as needed for pain.  Celiac serologies and alpha gal testing were normal.  Patient reports that she had a food sensitivity test on 02/28/2020 which showed intolerance to almond, barley, cahsew, chicken, gluten, cow milk, oats, orange, pea, pepper, squash, walnut, wheat.  She reports that prior to implementing low FODMAP diet she tried doing a bland diet avoiding the foods listed in the food sensitivity test and he states that since then her symptoms have much more improved as she has not presented any more abdominal pain and diarrhea. Denies any complaints at the moment such as nausea, vomiting, fever, chills, hematochezia, melena, hematemesis, abdominal distention, abdominal pain, diarrhea, jaundice, pruritus or weight loss. States that she only felt some abdominal heaviness after eating a piece of chicken the last time she was visiting a friend. Has avoided the previously mentioned food.  Last Colonoscopy: 2022 - One 2 mm polyp in the transverse colon, removed with a cold biopsy forceps (path TA)  Past Medical History: Past Medical History:  Diagnosis Date  . Angio-edema   . Asthma   . Chest pain   . Complication of anesthesia    PONV  . Dizziness   .  Family history of diabetes mellitus   . Family history of hypertension   . Heart murmur   . History of palpitations   . Mitral valve regurgitation   . Recurrent upper respiratory infection (URI)   . Seasonal allergies     Past Surgical History: Past Surgical History:  Procedure Laterality Date  . Abdominal Ablation  05/1999  . ABDOMINAL HYSTERECTOMY  07/01/2017  . COLONOSCOPY WITH PROPOFOL N/A 03/11/2020   Procedure: COLONOSCOPY WITH PROPOFOL;  Surgeon: Harvel Quale, MD;  Location: [facility];  Service: Gastroenterology;  Laterality: N/A;  1:00  . DOBUTAMINE STRESS ECHO  05/31/2009   exercised to stage 3 of bruce protocol - normal BP response, no ischemia by EKG & echo criteria  . POLYPECTOMY  03/11/2020   Procedure: POLYPECTOMY INTESTINAL;  Surgeon: Harvel Quale, MD;  Location: [facility];  Service: Gastroenterology;;  transverse colon polyp;   . TRANSTHORACIC ECHOCARDIOGRAM  07/2011   EF=>55%, LV hyperdynamic; MV leaflets appear thickened & mild MR; mild TR  . TUBAL LIGATION  10/2005  . WISDOM TOOTH EXTRACTION      Family History: Family History  Problem Relation Age of Onset  . Pneumonia Maternal Grandmother   . Asthma Mother   . Diabetes Mother   . Hypertension Mother   . Allergic rhinitis Mother        Allergic to "soy milk"  . Diabetes Father   . Hypertension Father   . Eczema Son   . Psoriasis Son   . Heart disease Maternal Uncle   .  Heart attack Maternal Uncle        3 heart attacks  . Eczema Son   . Asthma Son   . Eczema Son     Social History: Social History   Tobacco Use  Smoking Status Never Smoker  Smokeless Tobacco Never Used   Social History   Substance and Sexual Activity  Alcohol Use No   Social History   Substance and Sexual Activity  Drug Use No    Allergies: Allergies  Allergen Reactions  . Minocycline Swelling  . Tea Tree Oil Swelling, Itching and Rash    Reaction to to tea tree oil, Facial  swelling Reaction to to tea tree oil, Facial swelling   . Other   . Penicillins Other (See Comments)    Causes yeast infection  . Sulfa Antibiotics   . Sulfamethoxazole Swelling    Medications: Current Outpatient Medications  Medication Sig Dispense Refill  . albuterol (PROVENTIL HFA;VENTOLIN HFA) 108 (90 Base) MCG/ACT inhaler Inhale 2 puffs into the lungs every 4 (four) hours as needed for wheezing or shortness of breath. 1 Inhaler 1  . azelastine (ASTELIN) 0.1 % nasal spray PLACE 1-2 SPRAYS INTO BOTH NOSTRILS 2 (TWO) TIMES DAILY. (Patient taking differently: Place 1-2 sprays into both nostrils 2 (two) times daily as needed for allergies.) 90 mL 1  . beclomethasone (QVAR REDIHALER) 40 MCG/ACT inhaler Inhale 1 puff into the lungs 2 (two) times daily. (Patient taking differently: Inhale 1 puff into the lungs 2 (two) times daily as needed (asthma).) 3 Inhaler 1  . cetirizine (ZYRTEC) 10 MG chewable tablet Chew 10 mg by mouth daily.    . Chlorphen-Phenyltolox-PE-APAP (NOREL SR PO) Take 1 tablet by mouth daily as needed (allergies).    . clobetasol (TEMOVATE) 0.05 % external solution Apply 1 application topically 2 (two) times daily as needed (irritation).    . DERMA-SMOOTHE/FS SCALP 0.01 % OIL Apply 1 application topically daily as needed (irritation).    Marland Kitchen EPINEPHrine (AUVI-Q) 0.3 mg/0.3 mL IJ SOAJ injection Inject 0.3 mLs (0.3 mg total) into the muscle as needed for anaphylaxis. 2 each 1  . montelukast (SINGULAIR) 10 MG tablet TAKE 1 TABLET BY MOUTH EVERYDAY AT BEDTIME (Patient taking differently: Take 10 mg by mouth at bedtime as needed (asthma).) 90 tablet 0  . Multiple Vitamins-Minerals (MULTIVITAMIN WITH MINERALS) tablet Take 1 tablet by mouth daily.    . norethindrone (MICRONOR) 0.35 MG tablet Take 1 tablet (0.35 mg total) by mouth daily. 84 tablet 4  . hyoscyamine (LEVSIN SL) 0.125 MG SL tablet Place 1 tablet (0.125 mg total) under the tongue every 6 (six) hours as needed for cramping.  (Patient not taking: No sig reported) 30 tablet 2   No current facility-administered medications for this visit.    Review of Systems: GENERAL: negative for malaise, night sweats HEENT: No changes in hearing or vision, no nose bleeds or other nasal problems. NECK: Negative for lumps, goiter, pain and significant neck swelling RESPIRATORY: Negative for cough, wheezing CARDIOVASCULAR: Negative for chest pain, leg swelling, palpitations, orthopnea GI: SEE HPI MUSCULOSKELETAL: Negative for joint pain or swelling, back pain, and muscle pain. SKIN: Negative for lesions, rash PSYCH: Negative for sleep disturbance, mood disorder and recent psychosocial stressors. HEMATOLOGY Negative for prolonged bleeding, bruising easily, and swollen nodes. ENDOCRINE: Negative for cold or heat intolerance, polyuria, polydipsia and goiter. NEURO: negative for tremor, gait imbalance, syncope and seizures. The remainder of the review of systems is noncontributory.   Physical Exam: BP 110/75 (BP Location:  Left Arm, Patient Position: Sitting, Cuff Size: Large)   Pulse 73   Temp 98.9 F (37.2 C) (Oral)   Ht 5' 3.5" (1.613 m)   Wt 217 lb (98.4 kg)   LMP 04/18/2017   BMI 37.84 kg/m  GENERAL: The patient is AO x3, in no acute distress. Obese. HEENT: Head is normocephalic and atraumatic. EOMI are intact. Mouth is well hydrated and without lesions. NECK: Supple. No masses LUNGS: Clear to auscultation. No presence of rhonchi/wheezing/rales. Adequate chest expansion HEART: RRR, normal s1 and s2. ABDOMEN: Soft, nontender, no guarding, no peritoneal signs, and nondistended. BS +. No masses. EXTREMITIES: Without any cyanosis, clubbing, rash, lesions or edema. NEUROLOGIC: AOx3, no focal motor deficit. SKIN: no jaundice, no rashes  Imaging/Labs: as above  I personally reviewed and interpreted the available labs, imaging and endoscopic files.  Impression and Plan: Hailey Lam is a 50 y.o. female with PMH asthma  and IBS, who presents for follow up of abdominal pain and diarrhea.  Her symptoms are highly consistent with IBS, which fortunately has improved after she eliminated the food that was found to be associated with sensitivity.  I advised her that no further investigation was warranted at this point but she could attempt to try taking some of the food that was listed as leading to sensitivity and determine which one could be potentially tolerated if she is interested. She can discuss further with her dietitian how to tailor her diet further.  - Follow with dietitian regarding tailoring diet  All questions were answered.      Harvel Quale, MD Gastroenterology and Hepatology [facility]

## 2020-05-20 ENCOUNTER — Ambulatory Visit: Payer: 59 | Admitting: Allergy & Immunology

## 2020-05-27 ENCOUNTER — Other Ambulatory Visit: Payer: Self-pay | Admitting: Family Medicine

## 2020-05-27 ENCOUNTER — Other Ambulatory Visit: Payer: Self-pay

## 2020-05-27 ENCOUNTER — Ambulatory Visit (INDEPENDENT_AMBULATORY_CARE_PROVIDER_SITE_OTHER): Payer: 59 | Admitting: Family Medicine

## 2020-05-27 ENCOUNTER — Encounter: Payer: Self-pay | Admitting: Family Medicine

## 2020-05-27 VITALS — BP 118/88 | HR 83 | Temp 97.9°F | Resp 16 | Ht 63.5 in | Wt 219.2 lb

## 2020-05-27 DIAGNOSIS — L232 Allergic contact dermatitis due to cosmetics: Secondary | ICD-10-CM

## 2020-05-27 DIAGNOSIS — J3089 Other allergic rhinitis: Secondary | ICD-10-CM | POA: Diagnosis not present

## 2020-05-27 DIAGNOSIS — J4520 Mild intermittent asthma, uncomplicated: Secondary | ICD-10-CM

## 2020-05-27 DIAGNOSIS — T7800XD Anaphylactic reaction due to unspecified food, subsequent encounter: Secondary | ICD-10-CM

## 2020-05-27 DIAGNOSIS — K9049 Malabsorption due to intolerance, not elsewhere classified: Secondary | ICD-10-CM | POA: Diagnosis not present

## 2020-05-27 DIAGNOSIS — J302 Other seasonal allergic rhinitis: Secondary | ICD-10-CM

## 2020-05-27 MED ORDER — MONTELUKAST SODIUM 10 MG PO TABS: ORAL_TABLET | ORAL | 0 refills | Status: DC

## 2020-05-27 MED ORDER — QVAR REDIHALER 80 MCG/ACT IN AERB: 2.0000 | INHALATION_SPRAY | Freq: Two times a day (BID) | RESPIRATORY_TRACT | 5 refills | Status: DC

## 2020-05-27 MED ORDER — CETIRIZINE HCL 10 MG PO CHEW: 10.0000 mg | CHEWABLE_TABLET | Freq: Every day | ORAL | 5 refills | Status: DC

## 2020-05-27 MED ORDER — TRIAMCINOLONE ACETONIDE 0.1 % EX CREA: 1.0000 "application " | TOPICAL_CREAM | Freq: Two times a day (BID) | CUTANEOUS | 0 refills | Status: DC

## 2020-05-27 MED ORDER — AZELASTINE HCL 0.1 % NA SOLN: NASAL | 1 refills | Status: DC

## 2020-05-27 NOTE — Addendum Note (Signed)
Addended by: Jaynie Crumble on: 05/27/2020 05:36 PM   Modules accepted: Orders

## 2020-05-27 NOTE — Progress Notes (Signed)
[place], [place] [address] Dept: [phone]  FOLLOW UP NOTE  Patient ID: Hailey Lam, female    DOB: 06/12/1970  Age: 50 y.o. MRN: [id number] Date of Office Visit: 05/27/2020  Assessment  Chief Complaint: Asthma (Says her asthma is fine/CT: 23), Allergic Reaction (Deodorant is causing an allergic reaction. Breaks out on hives and rash and spreads and it swells. Felt like her underarms were on fire. ), and Allergic Rhinitis  (Says they are good. Weather causes flare up but says she believes they are under control.)  HPI Hailey Lam is a 50 year old female who presents to the clinic for a follow up visit. She was last seen in this clinic on 11/20/2019 by Dr. 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 At today's visit, she reports her asthma has been well controlled with no shortness of breath, cough or wheeze. She continues montelukast 10 mg once a day as needed during pollen season beginning in March. She is not currently taking montelukast, using albuterol, or using Qvar 80. Allergic rhinitis is reported as well controlled with only occasional post nasal drainage for which she takes cetirizine 10 mg once  Day. She is not currently using Flonase or nasal saline rinses. She reports allergic conjunctivitis is well controlled with cetirizine daily. She reports that she developed a red, raised rash with slight swelling that began the same day after using a new fragrance of the same brand of deodorant that she usually uses. She continues to avoid soy and egg with no accidental ingestion or epinephrine use since her last visit to this clinic. She does report that she is eating up to 4 strawberries at a time and eating shrimp occasionally (not shrimp that is from anywhere outside the [place]) with no adverse reaction. She reports that she did go to a GI specialist who she reports dvised her  against sensitivity testing. She preceded to use an independent lab for sensitivity testing and has stopped eating almonds, barley, cashew, chicken, gluten, milk, green olive, oregano, peach, pepper, squash, walnut, and wheat. She reports fewer incidences of abdominal pain after implementing these changes. Her current medications are listed in the chart.  Drug Allergies:  Allergies  Allergen Reactions  . Minocycline Swelling  . Tea Tree Oil Swelling, Itching and Rash    Reaction to to tea tree oil, Facial swelling Reaction to to tea tree oil, Facial swelling   . Other   . Penicillins Other (See Comments)    Causes yeast infection  . Sulfa Antibiotics   . Sulfamethoxazole Swelling    Physical Exam: BP 118/88 (BP Location: Right Arm, Patient Position: Sitting, Cuff Size: Large)   Pulse 83   Temp 97.9 F (36.6 C) (Temporal)   Resp 16   Ht 5' 3.5" (1.613 m)   Wt 219 lb 3.2 oz (99.4 kg)   LMP 04/18/2017   SpO2 100%   BMI 38.22 kg/m    Physical Exam Vitals reviewed.  Constitutional:      Appearance: Normal appearance.  HENT:     Head: Normocephalic and atraumatic.     Right Ear: Tympanic membrane normal.     Left Ear: Tympanic membrane normal.     Nose:     Comments: Bilateral nares normal. Pharynx normal. Ears normal. Eyes normal.    Mouth/Throat:     Pharynx: Oropharynx is clear.  Eyes:     Conjunctiva/sclera: Conjunctivae normal.  Cardiovascular:     Rate and Rhythm: Normal rate and regular rhythm.     Heart sounds: Normal heart sounds. No murmur heard.   Pulmonary:     Effort: Pulmonary effort is normal.     Breath sounds: Normal breath sounds.     Comments: Lungs clear to auscultation Musculoskeletal:        General: Normal range of motion.     Cervical back: Normal range of motion and neck supple.  Skin:    General: Skin is warm and dry.  Neurological:     Mental Status: She is alert and oriented to person, place, and time.  Psychiatric:        Mood and  Affect: Mood normal.        Behavior: Behavior normal.        Thought Content: Thought content normal.        Judgment: Judgment normal.     Diagnostics: FVC 2.32, FEV1 1.68. Predicted FVC 2.88, predicted FEV1 2.33. Spirometry indicates normal ventilatory function  Assessment and Plan: 1. Mild intermittent asthma without complication   2. Seasonal and perennial allergic rhinitis   3. Allergic contact dermatitis due to cosmetics   4. Food intolerance   5. Anaphylaxis due to food, subsequent encounter     Meds ordered this encounter  Medications  . triamcinolone cream (KENALOG) 0.1 %    Sig: Apply 1 application topically 2 (two) times daily.    Dispense:  30 g    Refill:  0    Patient Instructions  Asthma Continue albuterol 2 puffs once every 4 hours as needed for cough or wheeze You may use albuterol 2 puffs 5-15 minutes before activity to decrease cough or wheeze For asthma flare, begin Qvar 80-2 puffs twice a day and add montelukast 10 mg once a day for 2 weeks or until cough and wheeze free  Allergic rhinitis Continue allergen avoidance measures directed toward tree pollen, grass pollen, weed pollen, dust mites, and cockroach as listed below Continue cetirizine 10 mg once a day as needed for a runny nose or itch Continue azelastine 2 sprays in each nostril up to twice a day as needed for a runny nose  Allergic contact dermatitis Continue to avoid products containing paraben, fragrance mix, and tea tree.  I will enter these allergies into the dermatologic web site and send the results to your email If your rash does not improve, you may begin triamcinolone 0.1% ointment twice a day as needed to red, itchy areas below your face  Food allergy Continue to avoid shrimp, strawberry, egg, and soy.  In case of an allergic reaction, take Benadryl 50 mg every 4 hours, and if life-threatening symptoms occur, inject with AuviQ 0.3 mg.  Call the clinic if this treatment plan is not  working well for you  Follow up in 6 months or sooner if needed.    Return in about 6 months (around 11/27/2020), or if symptoms worsen or fail to improve.    Thank you for the opportunity to care for this patient.  Please do not hesitate to contact me with questions.  Gareth Morgan, FNP [facility]

## 2020-05-27 NOTE — Patient Instructions (Addendum)
Asthma Continue albuterol 2 puffs once every 4 hours as needed for cough or wheeze You may use albuterol 2 puffs 5-15 minutes before activity to decrease cough or wheeze For asthma flare, begin Qvar 80-2 puffs twice a day and add montelukast 10 mg once a day for 2 weeks or until cough and wheeze free  Allergic rhinitis Continue allergen avoidance measures directed toward tree pollen, grass pollen, weed pollen, dust mites, and cockroach as listed below Continue cetirizine 10 mg once a day as needed for a runny nose or itch Continue azelastine 2 sprays in each nostril up to twice a day as needed for a runny nose  Allergic contact dermatitis Continue to avoid products containing paraben, fragrance mix, and tea tree.  I will enter these allergies into the dermatologic web site and send the results to your email If your rash does not improve, you may begin triamcinolone 0.1% ointment twice a day as needed to red, itchy areas below your face  Food allergy Continue to avoid shrimp, strawberry, egg, and soy.  In case of an allergic reaction, take Benadryl 50 mg every 4 hours, and if life-threatening symptoms occur, inject with AuviQ 0.3 mg.  Call the clinic if this treatment plan is not working well for you  Follow up in 6 months or sooner if needed.

## 2020-05-27 NOTE — Addendum Note (Signed)
Addended by: Jaynie Crumble on: 05/27/2020 01:26 PM   Modules accepted: Orders

## 2020-11-03 ENCOUNTER — Encounter: Payer: Self-pay | Admitting: Obstetrics & Gynecology

## 2020-11-03 ENCOUNTER — Other Ambulatory Visit: Payer: Self-pay

## 2020-11-03 ENCOUNTER — Ambulatory Visit (INDEPENDENT_AMBULATORY_CARE_PROVIDER_SITE_OTHER): Payer: 59 | Admitting: Obstetrics & Gynecology

## 2020-11-03 VITALS — BP 118/76 | HR 81 | Resp 16 | Ht 63.25 in | Wt 216.0 lb

## 2020-11-03 DIAGNOSIS — Z6837 Body mass index (BMI) 37.0-37.9, adult: Secondary | ICD-10-CM

## 2020-11-03 DIAGNOSIS — Z8742 Personal history of other diseases of the female genital tract: Secondary | ICD-10-CM | POA: Diagnosis not present

## 2020-11-03 DIAGNOSIS — Z90711 Acquired absence of uterus with remaining cervical stump: Secondary | ICD-10-CM

## 2020-11-03 DIAGNOSIS — Z01419 Encounter for gynecological examination (general) (routine) without abnormal findings: Secondary | ICD-10-CM | POA: Diagnosis not present

## 2020-11-03 DIAGNOSIS — E6609 Other obesity due to excess calories: Secondary | ICD-10-CM | POA: Diagnosis not present

## 2020-11-03 MED ORDER — NORETHINDRONE 0.35 MG PO TABS: 1.0000 | ORAL_TABLET | Freq: Every day | ORAL | 4 refills | Status: DC

## 2020-11-03 NOTE — Progress Notes (Signed)
Hailey Lam 28-Jan-1970 [id number]   History:    50 y.o. [id number] Married   RP:  Established patient presenting for annual gyn exam    HPI: S/P Supracervical Hysterectomy.  Well on the Progestin-only BCP.  No current pelvic pain.  No vaginal bleeding. Urine/BMs normal.  Breasts normal.  BMI 37.96. Lost a little bit of weight x last year, but difficult.  Decreased working days to 3, which should help.  Health labs with Fam MD.    Past medical history,surgical history, family history and social history were all reviewed and documented in the EPIC chart.  Gynecologic History Patient's last menstrual period was 04/18/2017.  Obstetric History OB History  Gravida Para Term Preterm AB Living  4 3     1 3   SAB IAB Ectopic Multiple Live Births               # Outcome Date GA Lbr Len/2nd Weight Sex Delivery Anes PTL Lv  4 AB           3 Para           2 Para           1 Para              ROS: A ROS was performed and pertinent positives and negatives are included in the history.  GENERAL: No fevers or chills. HEENT: No change in vision, no earache, sore throat or sinus congestion. NECK: No pain or stiffness. CARDIOVASCULAR: No chest pain or pressure. No palpitations. PULMONARY: No shortness of breath, cough or wheeze. GASTROINTESTINAL: No abdominal pain, nausea, vomiting or diarrhea, melena or bright red blood per rectum. GENITOURINARY: No urinary frequency, urgency, hesitancy or dysuria. MUSCULOSKELETAL: No joint or muscle pain, no back pain, no recent trauma. DERMATOLOGIC: No rash, no itching, no lesions. ENDOCRINE: No polyuria, polydipsia, no heat or cold intolerance. No recent change in weight. HEMATOLOGICAL: No anemia or easy bruising or bleeding. NEUROLOGIC: No headache, seizures, numbness, tingling or weakness. PSYCHIATRIC: No depression, no loss of interest in normal activity or change in sleep pattern.     Exam:   BP 118/76   Pulse 81   Resp 16   Ht 5' 3.25" (1.607 m)   Wt  216 lb (98 kg)   LMP 04/18/2017   BMI 37.96 kg/m   Body mass index is 37.96 kg/m.  General appearance : Well developed well nourished female. No acute distress HEENT: Eyes: no retinal hemorrhage or exudates,  Neck supple, trachea midline, no carotid bruits, no thyroidmegaly Lungs: Clear to auscultation, no rhonchi or wheezes, or rib retractions  Heart: Regular rate and rhythm, no murmurs or gallops Breast:Examined in sitting and supine position were symmetrical in appearance, no palpable masses or tenderness,  no skin retraction, no nipple inversion, no nipple discharge, no skin discoloration, no axillary or supraclavicular lymphadenopathy Abdomen: no palpable masses or tenderness, no rebound or guarding Extremities: no edema or skin discoloration or tenderness  Pelvic: Vulva: Normal             Vagina: No gross lesions or discharge  Cervix: No gross lesions or discharge  Uterus Absent  Adnexa  Without masses or tenderness  Anus: Normal   Assessment/Plan:  50 y.o. female for annual exam   1. Well female exam with routine gynecological exam Gynecologic exam status post supracervical hysterectomy.  No indication for a Pap test this year.  Pap test October 2021 was negative.  Breast exam normal.  Screening  mammogram - January 2022.  Colonoscopy February 2022.  Health labs with family physician.  2. History of abdominal supracervical subtotal hysterectomy  3. H/O ovulatory pain Well on the progestin pill to prevent ovulatory pain.  No contraindication to continue.  Prescription sent to pharmacy.  4. Class 2 obesity due to excess calories without serious comorbidity with body mass index (BMI) of 37.0 to 37.9 in adult Patient decreased the number of days of work to 3 days a week recently.  Will increase fitness activities.  Other orders - cetirizine (ZYRTEC) 10 MG tablet; Take 10 mg by mouth daily. - B Complex Vitamins (B COMPLEX PO); Take by mouth. - norethindrone (MICRONOR) 0.35  MG tablet; Take 1 tablet (0.35 mg total) by mouth daily.   Princess Bruins MD, 10:46 AM 11/03/2020

## 2020-12-02 ENCOUNTER — Ambulatory Visit: Payer: 59 | Admitting: Allergy & Immunology

## 2020-12-30 ENCOUNTER — Encounter: Payer: Self-pay | Admitting: Allergy & Immunology

## 2020-12-30 ENCOUNTER — Other Ambulatory Visit: Payer: Self-pay

## 2020-12-30 ENCOUNTER — Ambulatory Visit: Payer: 59 | Admitting: Allergy & Immunology

## 2020-12-30 VITALS — BP 130/90 | HR 92 | Temp 97.2°F | Resp 16

## 2020-12-30 DIAGNOSIS — T7800XD Anaphylactic reaction due to unspecified food, subsequent encounter: Secondary | ICD-10-CM

## 2020-12-30 DIAGNOSIS — J3089 Other allergic rhinitis: Secondary | ICD-10-CM | POA: Diagnosis not present

## 2020-12-30 DIAGNOSIS — K9049 Malabsorption due to intolerance, not elsewhere classified: Secondary | ICD-10-CM

## 2020-12-30 DIAGNOSIS — J4520 Mild intermittent asthma, uncomplicated: Secondary | ICD-10-CM

## 2020-12-30 DIAGNOSIS — L232 Allergic contact dermatitis due to cosmetics: Secondary | ICD-10-CM

## 2020-12-30 DIAGNOSIS — J302 Other seasonal allergic rhinitis: Secondary | ICD-10-CM

## 2020-12-30 MED ORDER — EPINEPHRINE 0.3 MG/0.3ML IJ SOAJ: 0.3000 mg | INTRAMUSCULAR | 1 refills | Status: DC | PRN

## 2020-12-30 MED ORDER — MONTELUKAST SODIUM 10 MG PO TABS: ORAL_TABLET | ORAL | 1 refills | Status: DC

## 2020-12-30 MED ORDER — ALBUTEROL SULFATE HFA 108 (90 BASE) MCG/ACT IN AERS: 2.0000 | INHALATION_SPRAY | RESPIRATORY_TRACT | 1 refills | Status: DC | PRN

## 2020-12-30 NOTE — Patient Instructions (Addendum)
1. Seasonal and perennial allergic rhinitis (trees, weeds, grasses, dust mites and cockroach) - Continue with: Zyrtec (cetirizine) 10mg  tablet once daily  - Continue with: the Astelin up to two sprays per nostril twice daily AS NEEDED during spring - Continue with: a nasal steroid one spray per nostril twice daily AS NEEDED during the spring  - Use nasal saline rinses (NeilMed or Netti Pot) 1-2 times daily to move allergens and thin out mucous.  - Consider allergy shots as a means of long-term control.   2. Mild persistent asthma, uncomplicated - Lung function looked stable today.  - I think our plan is working WELL!  - We will send refills of everything since it is expired. - We are not going to send in the Qvar since you rarely use it, but call us if this is a problem.  - Daily controller medication(s): NOTHING - Prior to physical activity: albuterol 2 puffs 10-15 minutes before physical activity (if this is a trigger) - Rescue medications: albuterol 4 puffs every 4-6 hours as needed or albuterol nebulizer one vial every 4-6 hours as needed - Changes during respiratory infections or worsening symptoms: Add on Qvar 87mcg to 2 puffs three times daily and Singulair 10mg  for ONE TO TWO WEEKS. - Asthma control goals:  * Full participation in all desired activities (may need albuterol before activity) * Albuterol use two time or less a week on average (not counting use with activity) * Cough interfering with sleep two time or less a month * Oral steroids no more than once a year * No hospitalizations   3. Allergic contact dermatitis - controlled - It looks like you have your cosmetics all figured out. - Continue to use these products and avoid your other triggers.   4. 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  - Epinephrine is up to date.  - I am removing the shrimp from your diet since you are eating other shrimp types without a  problem.   5. Return in about 6 months (around 06/30/2021).   Please inform us of any Emergency Department visits, hospitalizations, or changes in symptoms. Call us before going to the ED for breathing or allergy symptoms since we might be able to fit you in for a sick visit. Feel free to contact us anytime with any questions, problems, or concerns.  It was a pleasure to see you again today!  Websites that have reliable patient information: 1. American Academy of Asthma, Allergy, and Immunology: www.aaaai.org 2. Food Allergy Research and Education (FARE): foodallergy.org 3. Mothers of Asthmatics: http://www.asthmacommunitynetwork.org 4. American College of Allergy, Asthma, and Immunology: www.acaai.org   COVID-19 Vaccine Information can be found at: ShippingScam.co.uk For questions related to vaccine distribution or appointments, please email vaccine@ .com or call [phone].     "Like" [name] on Facebook and Instagram for our latest updates!        Make sure you are registered to vote! If you have moved or changed any of your contact information, you will need to get this updated before voting!  In some cases, you MAY be able to register to vote online: [email or web address]    Allergy Shots   Allergies are the result of a chain reaction that starts in the immune system. Your immune system controls how your body defends itself. For instance, if you have an allergy to pollen, your immune system identifies pollen as an invader or allergen. Your immune system overreacts by producing antibodies  called Immunoglobulin E (IgE). These antibodies travel to cells that release chemicals, causing an allergic reaction.  The concept behind allergy immunotherapy, whether it is received in the form of shots or tablets, is that the immune system can be desensitized to specific allergens that trigger allergy  symptoms. Although it requires time and patience, the payback can be long-term relief.  How Do Allergy Shots Work?  Allergy shots work much like a vaccine. Your body responds to injected amounts of a particular allergen given in increasing doses, eventually developing a resistance and tolerance to it. Allergy shots can lead to decreased, minimal or no allergy symptoms.  There generally are two phases: build-up and maintenance. Build-up often ranges from three to six months and involves receiving injections with increasing amounts of the allergens. The shots are typically given once or twice a week, though more rapid build-up schedules are sometimes used.  The maintenance phase begins when the most effective dose is reached. This dose is different for each person, depending on how allergic you are and your response to the build-up injections. Once the maintenance dose is reached, there are longer periods between injections, typically two to four weeks.  Occasionally doctors give cortisone-type shots that can temporarily reduce allergy symptoms. These types of shots are different and should not be confused with allergy immunotherapy shots.  Who Can Be Treated with Allergy Shots?  Allergy shots may be a good treatment approach for people with allergic rhinitis (hay fever), allergic asthma, conjunctivitis (eye allergy) or stinging insect allergy.   Before deciding to begin allergy shots, you should consider:   The length of allergy season and the severity of your symptoms  Whether medications and/or changes to your environment can control your symptoms  Your desire to avoid long-term medication use  Time: allergy immunotherapy requires a major time commitment  Cost: may vary depending on your insurance coverage  Allergy shots for children age 70 and older are effective and often well tolerated. They might prevent the onset of new allergen sensitivities or the progression to asthma.  Allergy  shots are not started on patients who are pregnant but can be continued on patients who become pregnant while receiving them. In some patients with other medical conditions or who take certain common medications, allergy shots may be of risk. It is important to mention other medications you talk to your allergist.   When Will I Feel Better?  Some may experience decreased allergy symptoms during the build-up phase. For others, it may take as long as 12 months on the maintenance dose. If there is no improvement after a year of maintenance, your allergist will discuss other treatment options with you.  If you aren't responding to allergy shots, it may be because there is not enough dose of the allergen in your vaccine or there are missing allergens that were not identified during your allergy testing. Other reasons could be that there are high levels of the allergen in your environment or major exposure to non-allergic triggers like tobacco smoke.  What Is the Length of Treatment?  Once the maintenance dose is reached, allergy shots are generally continued for three to five years. The decision to stop should be discussed with your allergist at that time. Some people may experience a permanent reduction of allergy symptoms. Others may relapse and a longer course of allergy shots can be considered.  What Are the Possible Reactions?  The two types of adverse reactions that can occur with allergy shots are local  and systemic. Common local reactions include very mild redness and swelling at the injection site, which can happen immediately or several hours after. A systemic reaction, which is less common, affects the entire body or a particular body system. They are usually mild and typically respond quickly to medications. Signs include increased allergy symptoms such as sneezing, a stuffy nose or hives.  Rarely, a serious systemic reaction called anaphylaxis can develop. Symptoms include swelling in the  throat, wheezing, a feeling of tightness in the chest, nausea or dizziness. Most serious systemic reactions develop within 30 minutes of allergy shots. This is why it is strongly recommended you wait in your doctor's office for 30 minutes after your injections. Your allergist is trained to watch for reactions, and his or her staff is trained and equipped with the proper medications to identify and treat them.  Who Should Administer Allergy Shots?  The preferred location for receiving shots is your prescribing allergist's office. Injections can sometimes be given at another facility where the physician and staff are trained to recognize and treat reactions, and have received instructions by your prescribing allergist.

## 2020-12-30 NOTE — Progress Notes (Signed)
FOLLOW UP  Date of Service/Encounter:  12/30/20   Assessment:   Mild persistent asthma, uncomplicated   Seasonal and perennial allergic rhinitis (trees, weeds, grasses, dust mites and cockroach)   Allergic contact dermatitis (parabens, fragrance mix, tea tree)   Adverse food reaction (strawberries, egg, soy)   Poor compliance - secondary to preference for holistic therapies  Plan/Recommendations:   1. Seasonal and perennial allergic rhinitis (trees, weeds, grasses, dust mites and cockroach) - Continue with: Zyrtec (cetirizine) 10mg  tablet once daily  - Continue with: the Astelin up to two sprays per nostril twice daily AS NEEDED during spring - Continue with: a nasal steroid one spray per nostril twice daily AS NEEDED during the spring  - Use nasal saline rinses (NeilMed or Netti Pot) 1-2 times daily to move allergens and thin out mucous.  - Consider allergy shots as a means of long-term control.   2. Mild persistent asthma, uncomplicated - Lung function looked stable today.  - I think our plan is working WELL!  - We will send refills of everything since it is expired. - We are not going to send in the Qvar since you rarely use it, but call us if this is a problem.  - Daily controller medication(s): NOTHING - Prior to physical activity: albuterol 2 puffs 10-15 minutes before physical activity (if this is a trigger) - Rescue medications: albuterol 4 puffs every 4-6 hours as needed or albuterol nebulizer one vial every 4-6 hours as needed - Changes during respiratory infections or worsening symptoms: Add on Qvar 94mcg to 2 puffs three times daily and Singulair 10mg  for ONE TO TWO WEEKS. - Asthma control goals:  * Full participation in all desired activities (may need albuterol before activity) * Albuterol use two time or less a week on average (not counting use with activity) * Cough interfering with sleep two time or less a month * Oral steroids no more than once a year *  No hospitalizations   3. Allergic contact dermatitis - controlled - It looks like you have your cosmetics all figured out. - Continue to use these products and avoid your other triggers.   4. 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  - Epinephrine is up to date.  - I am removing the shrimp from your diet since you are eating other shrimp types without a problem.   5. Return in about 6 months (around 06/30/2021).   Subjective:   Hailey Lam is a 50 y.o. female presenting today for follow up of  Chief Complaint  Patient presents with   Follow-up    Hailey Lam has a history of the following: Patient Active Problem List   Diagnosis Date Noted   Bloating 02/04/2020   IBS (irritable bowel syndrome) 02/04/2020   Abdominal pain 02/04/2020   Multinodular goiter 06/20/2018   Seasonal and perennial allergic rhinitis 04/25/2017   Mild persistent asthma without complication 19/41/7408   Allergic contact dermatitis 04/25/2017   Adverse food reaction 04/25/2017   Encounter for routine gynecological examination 08/19/2013   Edema 04/14/2013   Neuropathy 04/14/2013   Atypical chest pain 04/14/2013    History obtained from: chart review and patient.  Hailey Lam is a 50 y.o. female presenting for a follow up visit.  She is well-known to the practice.  She has a history of asthma as well as allergic rhinitis, contact dermatitis, and food allergy.  She was last seen in May 2022 by one of  our nurse practitioners.  At that time, asthma was controlled with albuterol as needed.  We have had her on Qvar as needed in the past for flares, but she never is used it.  For her rhinitis, she has mostly spring and summer allergies as well as fall.  Winter is generally good time of the year for her.  We continued her on cetirizine as needed and Astelin as needed.  For her contact dermatitis, she continue to avoid all of her triggers.  She has a history of  both IgE and not IgE mediated reactions to foods.  Her Auvi-Q is up-to-date.  She had done an IgG test and was now avoiding other foods.  We explained the lack of predictive value for this test, but she was feeling better anyway so she continue to avoid these.  Since the last visit, she has done well.  She enjoys joy riding with her husband of 26 years.  She did go to the beach in her [place].  They also did some other day trips.  Asthma/Respiratory Symptom History: Asthma was good until she pulled out Christmas decorations. She started having SOB and wheezing. She took her albuterol and it helped. She did not add the Qvar at all. She has not needed prednisone or anything at all. She denies nighttime coughing or wheezing. She always forgets about the Qvar and she typically picks it up from the pharmacy and it expires without her even using it.  She has not needed prednisone and has not gone to the  emergency room for symptoms.  Allergic Rhinitis Symptom History: She is having a lot sinus drainage. She is doing her Netti pot. She is not having a fever. This has been going on for around a couple of months since the fall season started.  She has a number of nose sprays, but she forgets to use them. Spring is the worst time of the year for her symptoms. Summer is not bad. Winter is great. Fall is the worst time.   Food Allergy Symptom History: She has not needed her EpiPen.  She actually ate shrimp without a problem.  She thinks there was some kind and added it to the shrimp when she reacted to it before.  She said those transfer from [place].  She only buys American shrimp now.  She continues to avoid egg as well as soy and strawberry.  She also has her list of foods from IgG testing as she avoids.  Skin Symptom History: Skin is really under good control with avoidance of all of her triggers.  Patch testing that we did several years ago is really helped her.  She has figured out cosmetics that she uses.  She has  not had any flares since last time we saw her.  She does not use any topical steroids.  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  Otherwise, there have been no changes to her past medical history, surgical history, family history, or social history.    Review of Systems  Constitutional: Negative.  Negative for chills, fever, malaise/fatigue and weight loss.  HENT:  Positive for congestion and sinus pain. Negative for ear discharge and ear pain.   Eyes:  Negative for pain, discharge and redness.  Respiratory:  Negative for cough, sputum production, shortness of breath and wheezing.   Cardiovascular: Negative.  Negative for chest pain and palpitations.  Gastrointestinal:  Negative for abdominal pain, constipation, diarrhea, heartburn, nausea and vomiting.  Skin: Negative.  Negative  for itching and rash.  Neurological:  Negative for dizziness and headaches.  Endo/Heme/Allergies:  Positive for environmental allergies. Does not bruise/bleed easily.      Objective:   Blood pressure 130/90, pulse 92, temperature (!) 97.2 F (36.2 C), temperature source Temporal, resp. rate 16, last menstrual period 04/18/2017, SpO2 97 %. There is no height or weight on file to calculate BMI.   Physical Exam:  Physical Exam Vitals reviewed.  Constitutional:      Appearance: Normal appearance. She is well-developed.  HENT:     Head: Normocephalic and atraumatic.     Right Ear: Tympanic membrane, ear canal and external ear normal.     Left Ear: Tympanic membrane, ear canal and external ear normal.     Nose: No nasal deformity, septal deviation, mucosal edema or rhinorrhea.     Right Turbinates: Enlarged and swollen.     Left Turbinates: Enlarged and swollen.     Right Sinus: No maxillary sinus tenderness or frontal sinus tenderness.     Left Sinus: No maxillary sinus tenderness or frontal sinus tenderness.     Mouth/Throat:     Mouth: Mucous membranes are not pale and not dry.     Pharynx: Uvula  midline.  Eyes:     General: Lids are normal. No allergic shiner.       Right eye: No discharge.        Left eye: No discharge.     Conjunctiva/sclera: Conjunctivae normal.     Right eye: Right conjunctiva is not injected. No chemosis.    Left eye: Left conjunctiva is not injected. No chemosis.    Pupils: Pupils are equal, round, and reactive to light.  Cardiovascular:     Rate and Rhythm: Normal rate and regular rhythm.     Heart sounds: Normal heart sounds.  Pulmonary:     Effort: Pulmonary effort is normal. No tachypnea, accessory muscle usage or respiratory distress.     Breath sounds: Normal breath sounds. No wheezing, rhonchi or rales.     Comments: Moving air well in all lung fields.  Chest:     Chest wall: No tenderness.  Lymphadenopathy:     Cervical: No cervical adenopathy.  Skin:    General: Skin is warm.     Capillary Refill: Capillary refill takes less than 2 seconds.     Coloration: Skin is not pale.     Findings: No abrasion, erythema, petechiae or rash. Rash is not papular, urticarial or vesicular.     Comments: Skin looks fantastic.   Neurological:     Mental Status: She is alert.  Psychiatric:        Behavior: Behavior is cooperative.     Diagnostic studies:    Spirometry: results normal (FEV1: 1.88/78%, FVC: 2.30/77%, FEV1/FVC: 82%).    Spirometry consistent with normal pattern.    Allergy Studies: none    Salvatore Marvel, MD  [facility]

## 2021-01-28 IMAGING — MG DIGITAL SCREENING BILAT W/ CAD
4 series · 4 of 4 positions shown · non-contrast
Comparison: Previous exam(s).

CLINICAL DATA: Screening.

EXAM:
DIGITAL SCREENING BILATERAL MAMMOGRAM WITH CAD

[R MLO]
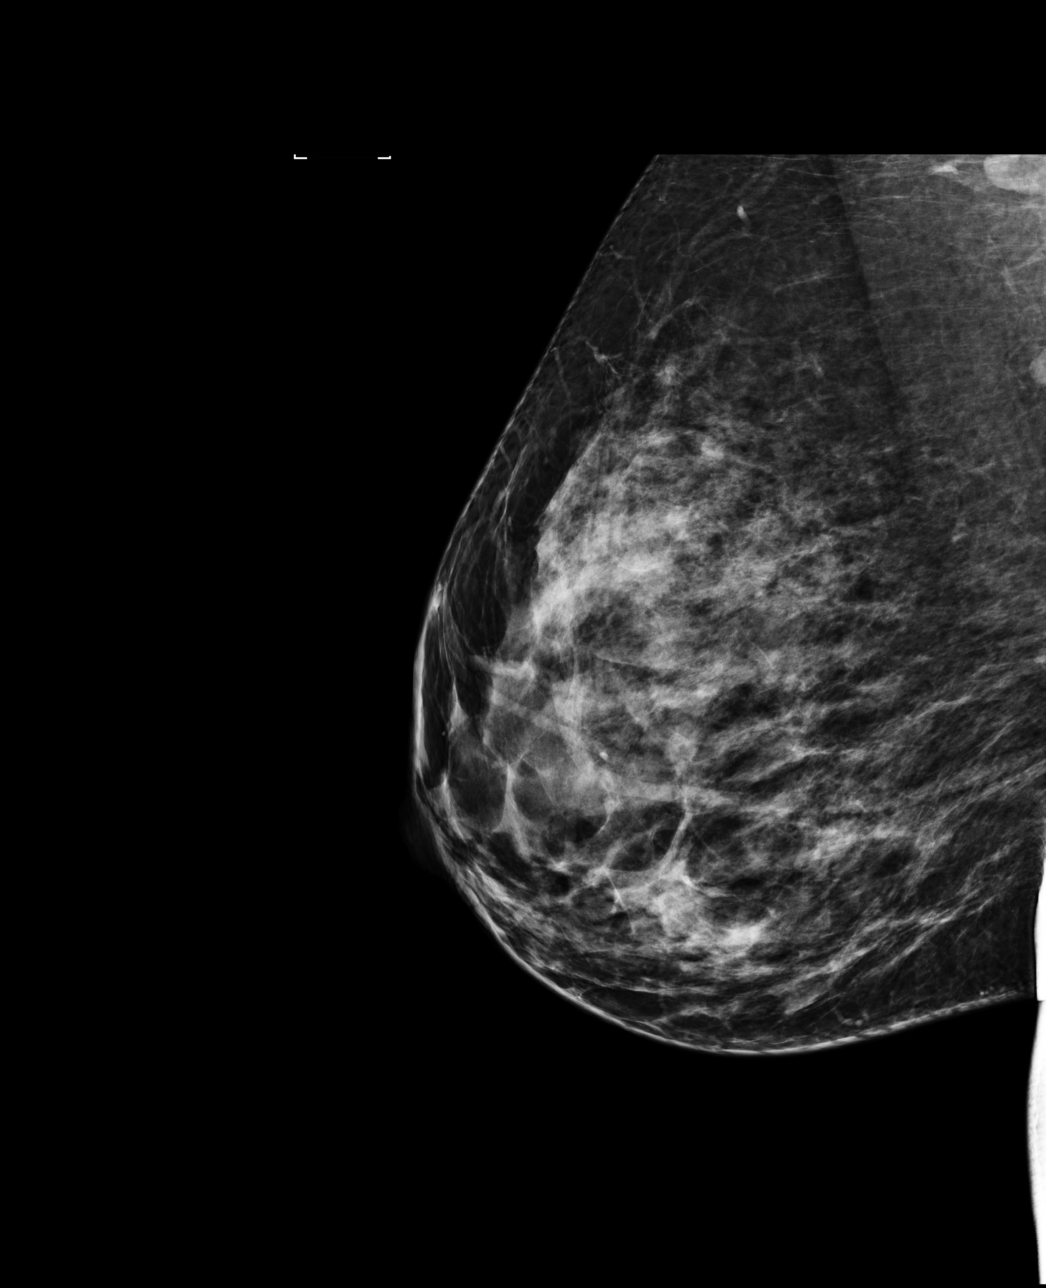

[R CC]
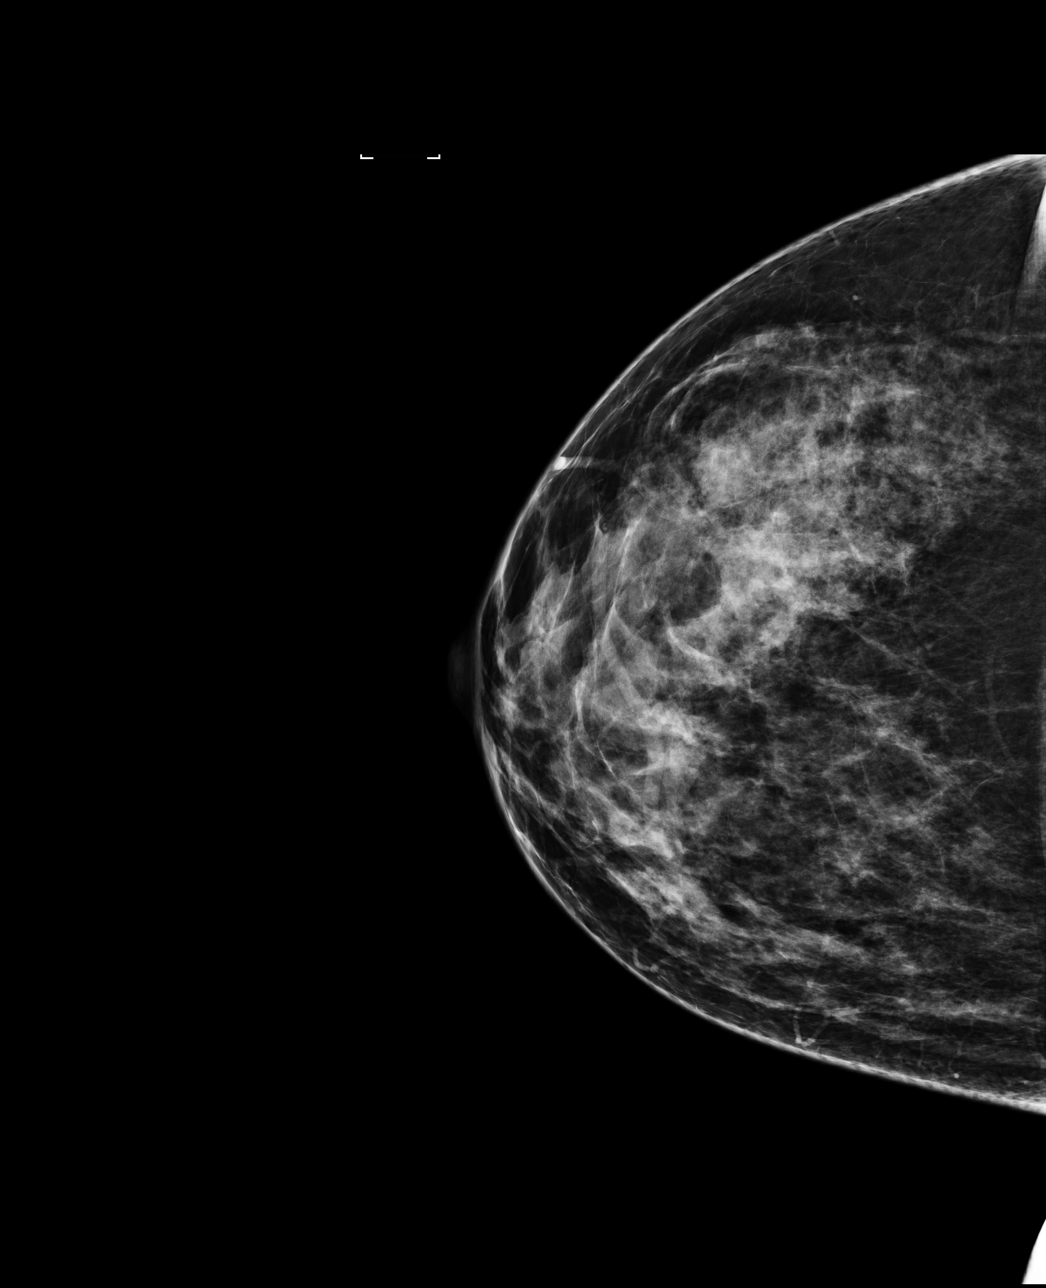

[L CC]
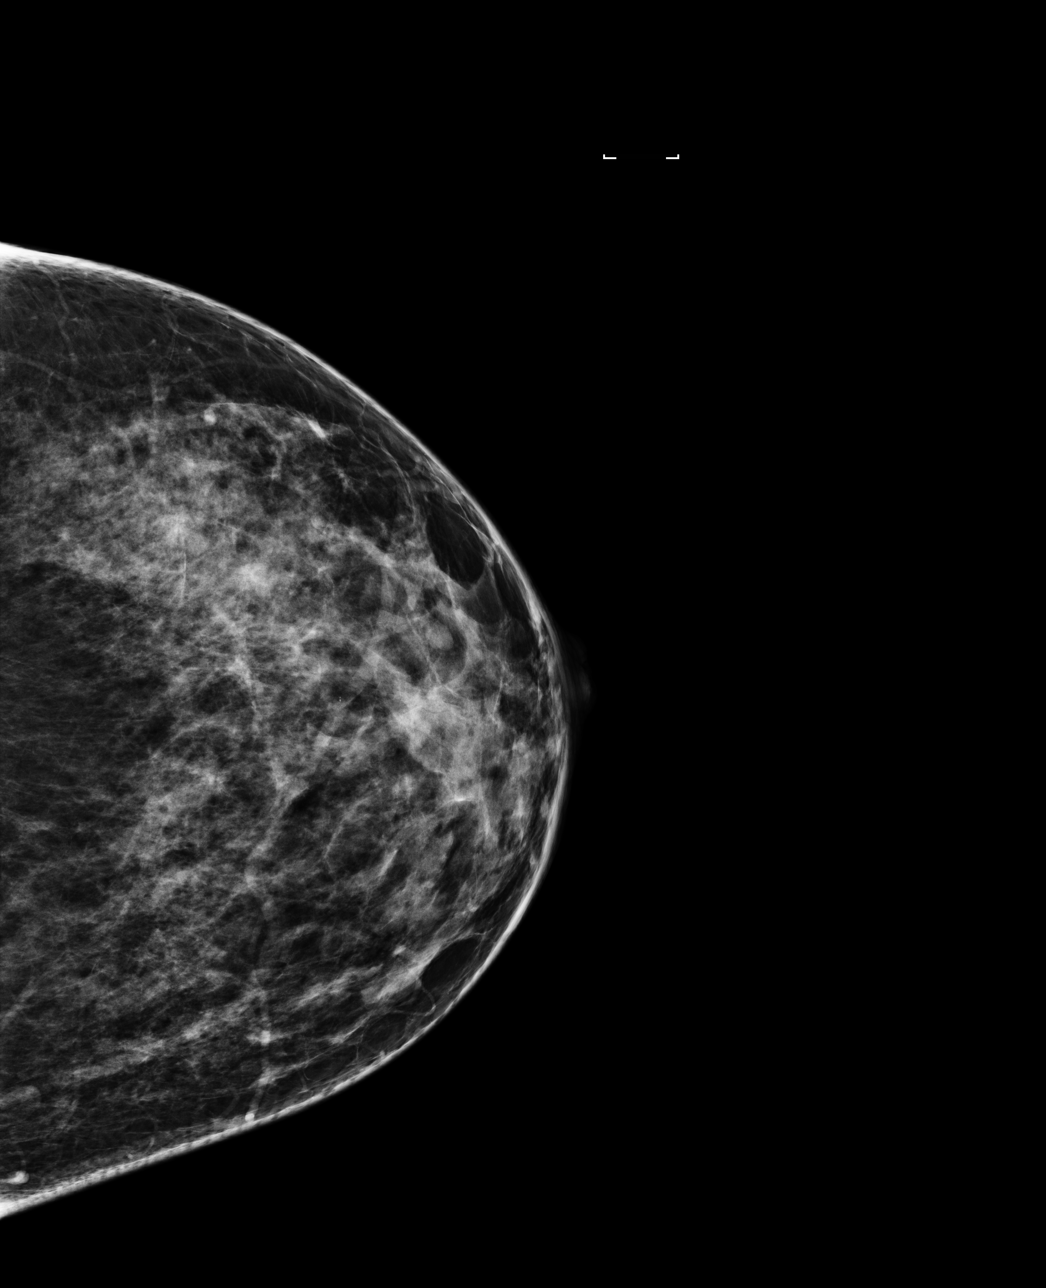

[L MLO]
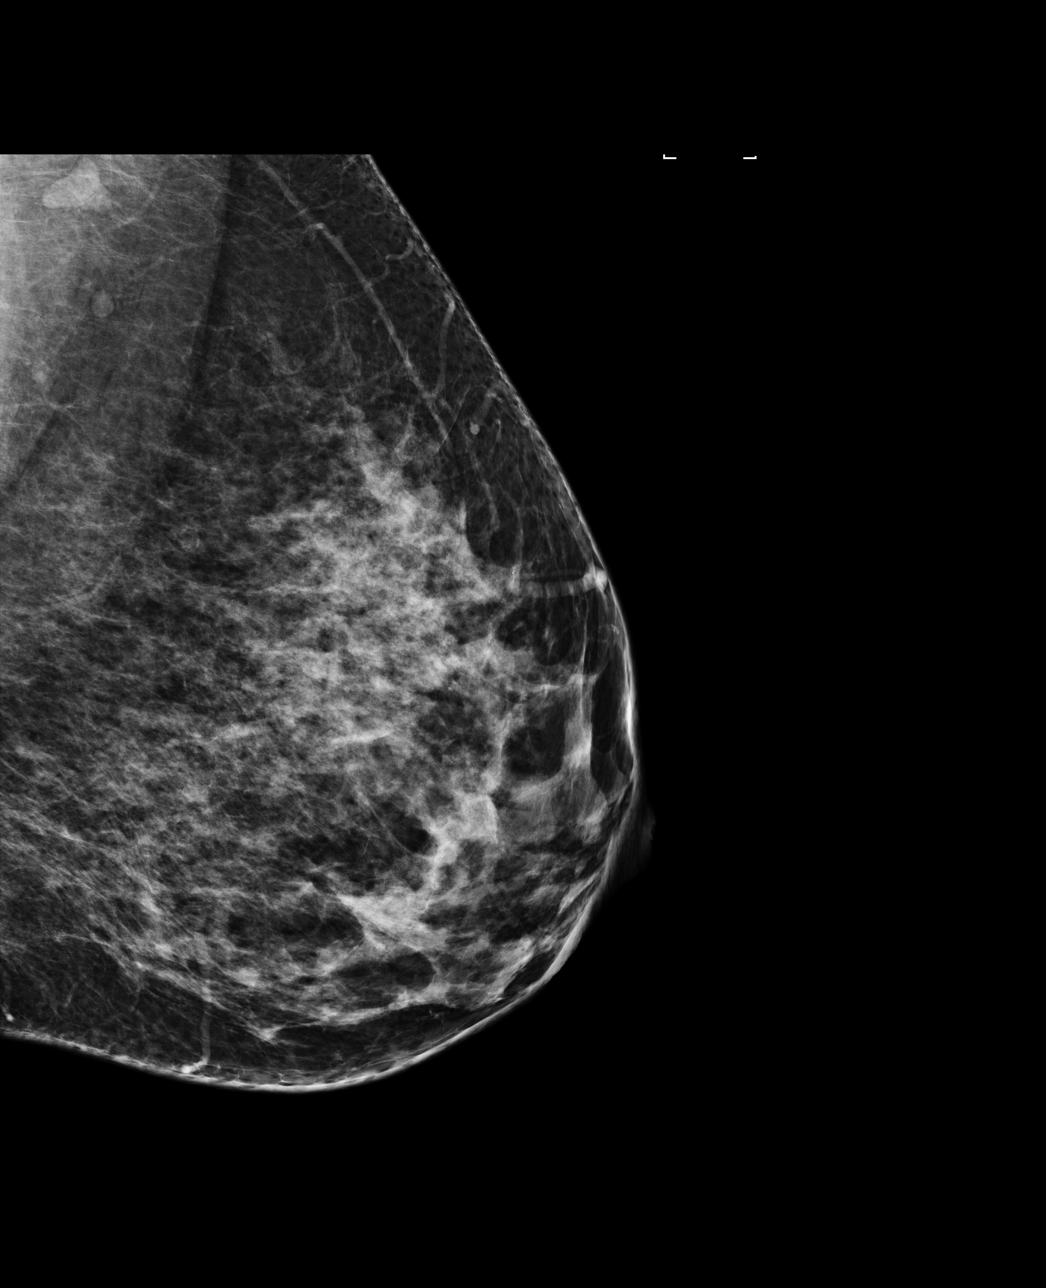

[4 of 4 positions shown; findings below may reference images not displayed]

ACR Breast Density Category c: The breast tissue is heterogeneously
dense, which may obscure small masses.
FINDINGS: There are no findings suspicious for malignancy. Images were
processed with CAD.
IMPRESSION: No mammographic evidence of malignancy. A result letter of this
screening mammogram will be mailed directly to the patient.

RECOMMENDATION:
Screening mammogram in one year. (Code:YJ-[id number])

BI-RADS CATEGORY  1: Negative.

## 2021-04-08 IMAGING — CT CT ABD-PELV W/ CM
2 of 5 series · 16 of 46 positions shown, 18 images · IV contrast (Omnipaque or Isovue)
Comparison: None.

CLINICAL DATA: Left lower quadrant abdominal pain.

EXAM:
CT ABDOMEN AND PELVIS WITH CONTRAST
TECHNIQUE: Multidetector CT imaging of the abdomen and pelvis was performed
using the standard protocol following bolus administration of
intravenous contrast.
CONTRAST:  100mL OMNIPAQUE IOHEXOL 300 MG/ML  SOLN

[Series 2: axial st · axial · 0.64mm/px · z∈[+874,+1268]mm · 13 of 91 slices shown, 15 images]
[im 6/91  soft-tissue]
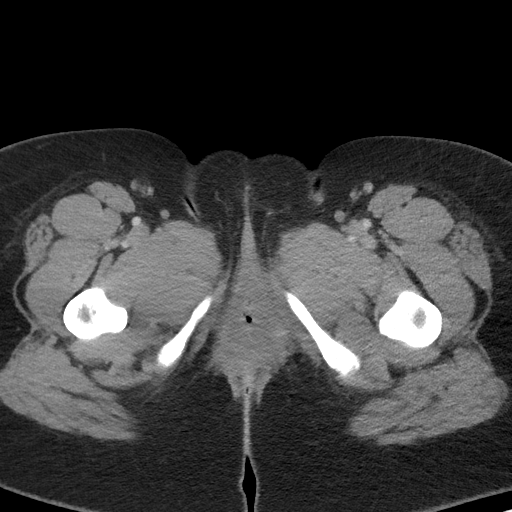
[im 6/91  bone]
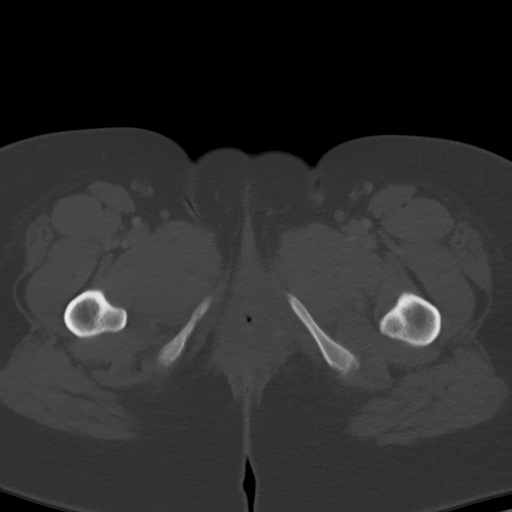
[im 11/91  soft-tissue]
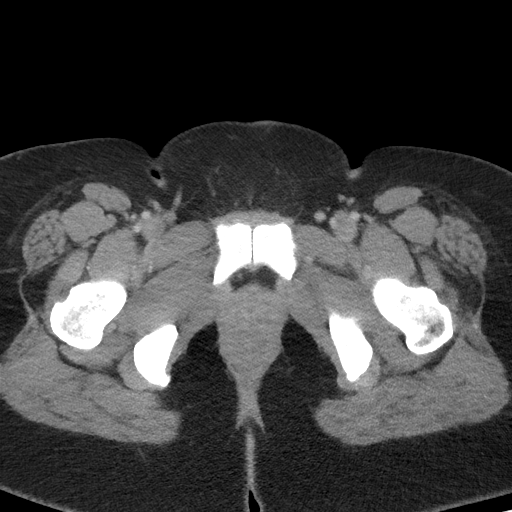
[im 22/91  soft-tissue]
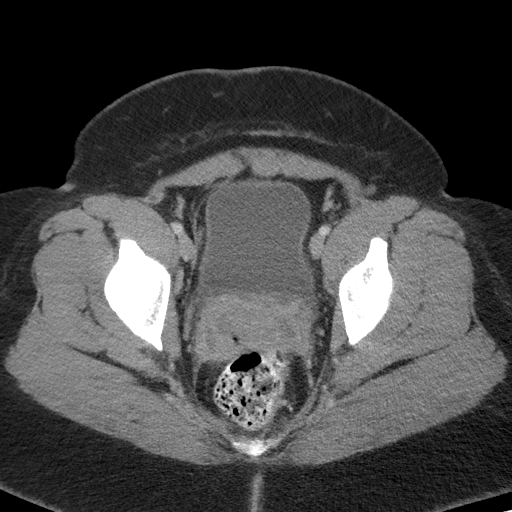
[im 27/91  soft-tissue]
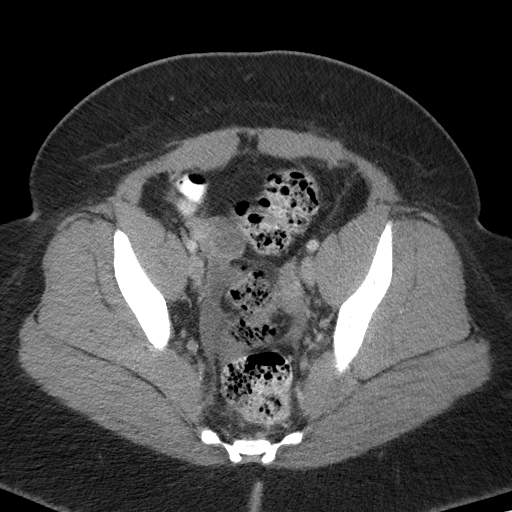
[im 32/91  soft-tissue]
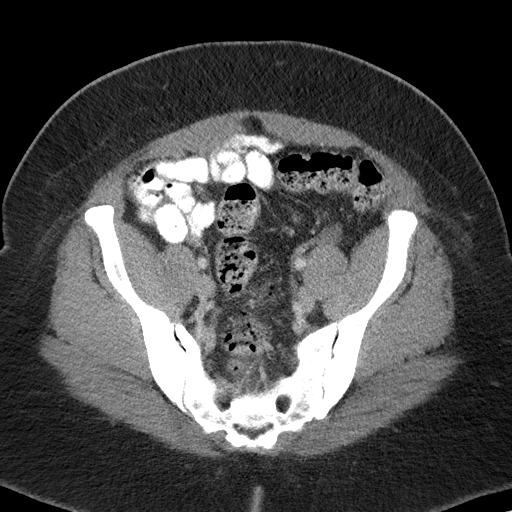
[im 38/91  soft-tissue]
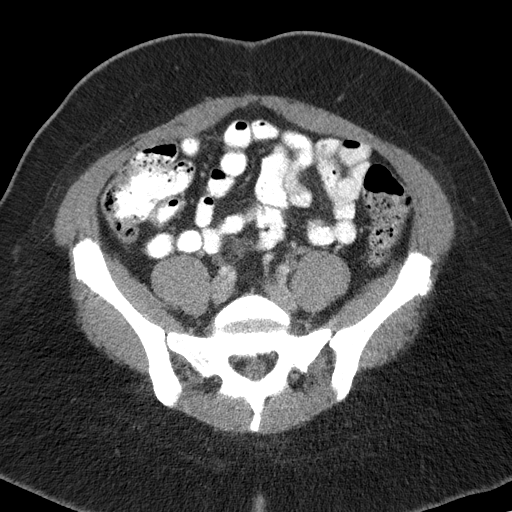
[im 48/91  soft-tissue]
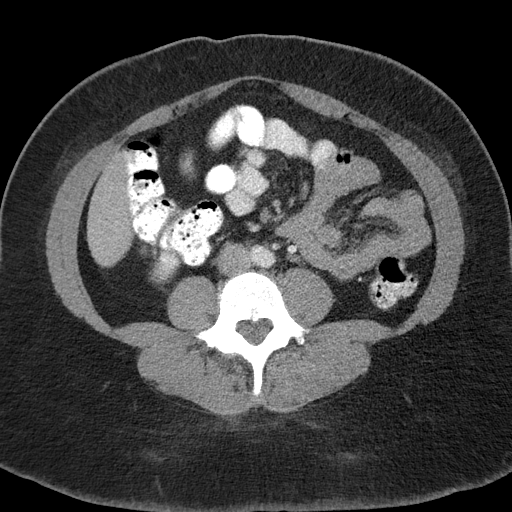
[im 53/91  soft-tissue]
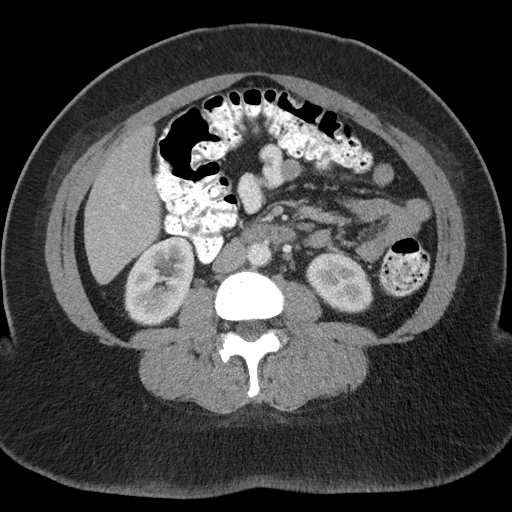
[im 59/91  soft-tissue]
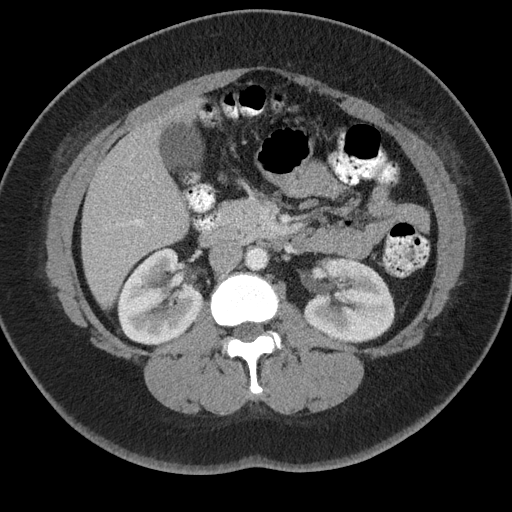
[im 59/91  bone]
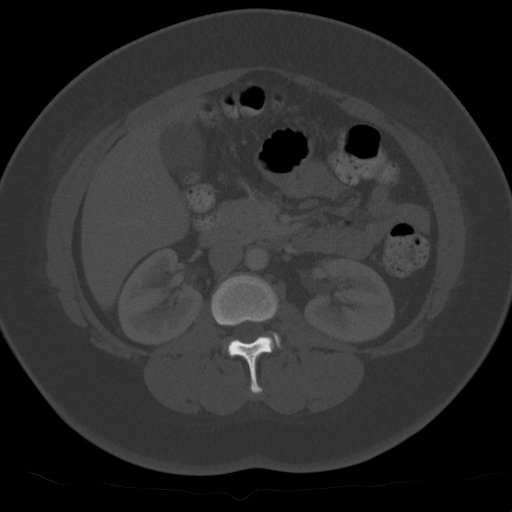
[im 64/91  soft-tissue]
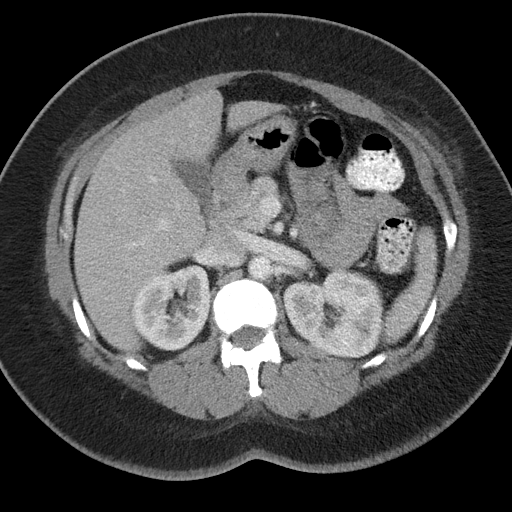
[im 69/91  soft-tissue]
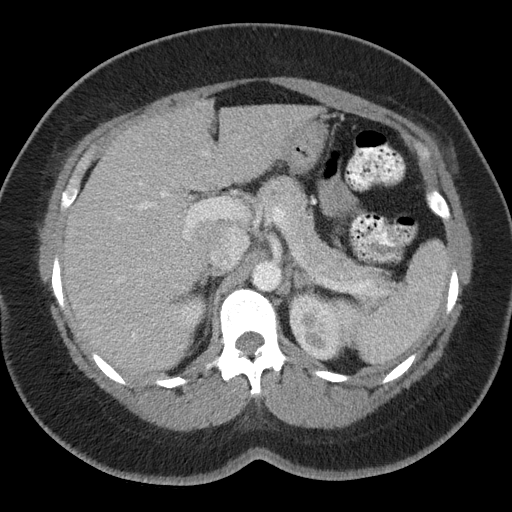
[im 80/91  soft-tissue]
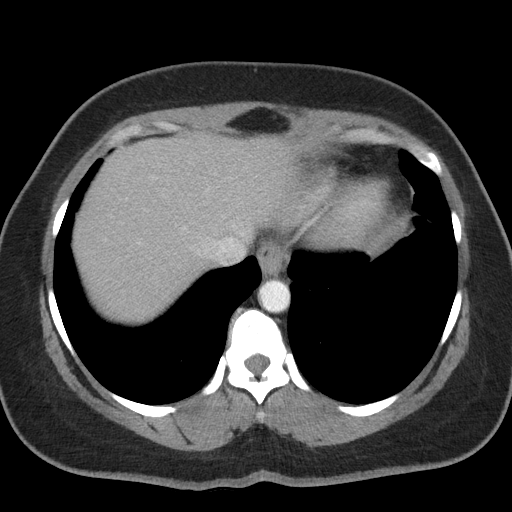
[im 85/91  soft-tissue]
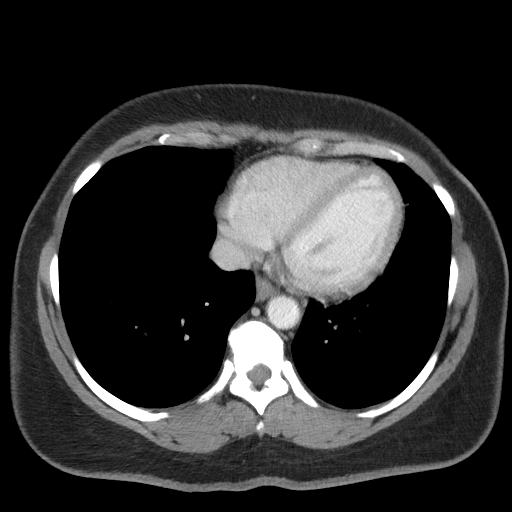

[Series 6: coronal st · coronal · 0.79mm/px · 3 of 101 slices shown]
[im 34/101  soft-tissue]
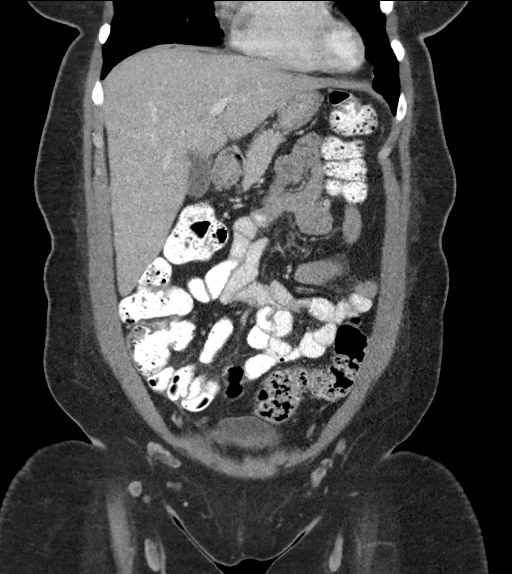
[im 45/101  soft-tissue]
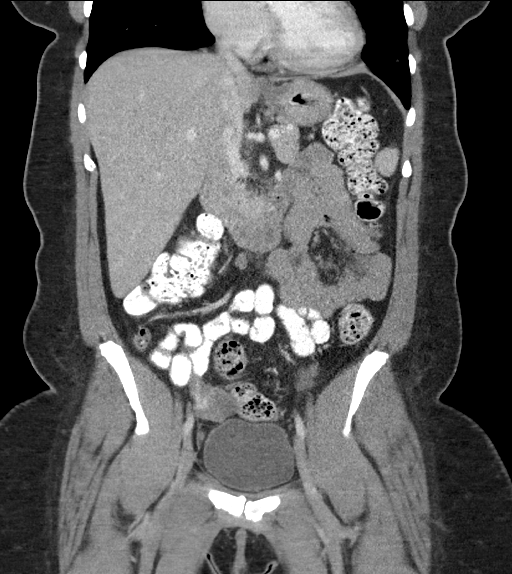
[im 56/101  soft-tissue]
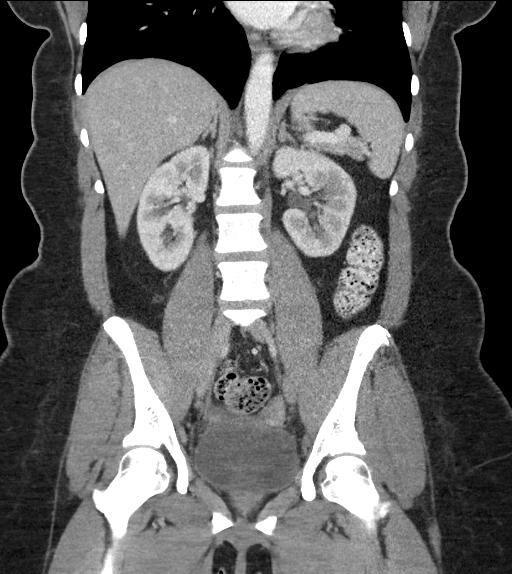

[16 of 46 positions shown; findings below may reference images not displayed]

FINDINGS: Lower chest: Unremarkable

Hepatobiliary: No suspicious focal abnormality within the liver
parenchyma. There is no evidence for gallstones, gallbladder wall
thickening, or pericholecystic fluid. No intrahepatic or
extrahepatic biliary dilation.

Pancreas: No focal mass lesion. No dilatation of the main duct. No
intraparenchymal cyst. No peripancreatic edema.

Spleen: No splenomegaly. No focal mass lesion.

Adrenals/Urinary Tract: No adrenal nodule or mass. Kidneys
unremarkable. No evidence for hydroureter. The urinary bladder
appears normal for the degree of distention.

Stomach/Bowel: Stomach is unremarkable. No gastric wall thickening.
No evidence of outlet obstruction. Duodenum is normally positioned
as is the ligament of Treitz. No small bowel wall thickening. No
small bowel dilatation. The terminal ileum is normal. The appendix
is normal. No gross colonic mass. No colonic wall thickening.

Vascular/Lymphatic: No abdominal aortic aneurysm. No abdominal
lymphadenopathy.

Reproductive: Uterus surgically absent. Cervix is heterogeneous. Gas
and possible fluid is noted in the vagina. No adnexal mass.

Other: Trace free fluid in the cul-de-sac.

Musculoskeletal: No worrisome lytic or sclerotic osseous
abnormality.
IMPRESSION: 1. No acute findings in the abdomen or pelvis. Specifically, no
findings to explain the patient's history of left lower quadrant
pain. No substantial diverticular disease in the colon and no
evidence for diverticulitis. No left adnexal mass.
2. Cervix is heterogeneous. Correlation with Pap smear history
recommended.
3. Trace free fluid in the cul-de-sac. This finding is nonspecific
in a premenopausal female.

## 2021-06-30 ENCOUNTER — Ambulatory Visit: Payer: 59 | Admitting: Allergy & Immunology

## 2021-07-10 ENCOUNTER — Encounter: Payer: Self-pay | Admitting: Family Medicine

## 2021-07-10 ENCOUNTER — Ambulatory Visit: Payer: BC Managed Care – PPO | Admitting: Family Medicine

## 2021-07-10 VITALS — BP 124/78 | HR 69 | Temp 98.1°F | Resp 18 | Ht 62.5 in | Wt 221.2 lb

## 2021-07-10 DIAGNOSIS — T7800XD Anaphylactic reaction due to unspecified food, subsequent encounter: Secondary | ICD-10-CM

## 2021-07-10 DIAGNOSIS — J302 Other seasonal allergic rhinitis: Secondary | ICD-10-CM

## 2021-07-10 DIAGNOSIS — J3089 Other allergic rhinitis: Secondary | ICD-10-CM

## 2021-07-10 DIAGNOSIS — L232 Allergic contact dermatitis due to cosmetics: Secondary | ICD-10-CM

## 2021-07-10 DIAGNOSIS — J4520 Mild intermittent asthma, uncomplicated: Secondary | ICD-10-CM | POA: Insufficient documentation

## 2021-07-10 MED ORDER — EPINEPHRINE 0.3 MG/0.3ML IJ SOAJ: 0.3000 mg | INTRAMUSCULAR | 1 refills | Status: DC | PRN

## 2021-07-10 NOTE — Progress Notes (Signed)
[place], [place] [address] Dept: [phone]  FOLLOW UP NOTE  Patient ID: Hailey Lam, female    DOB: April 01, 1970  Age: 51 y.o. MRN: [id number] Date of Office Visit: 07/10/2021  Assessment  Chief Complaint: Asthma (Asthma is good no issues. ), Allergic Rhinitis  (Allergies have been doing well. Is still using the zyrtec daily. It helps when she takes it. ), and Allergic Reaction (Is still avoiding soy, still includes eats some egg and is easing strawberries into the the diet)  HPI Hailey Lam is a 51 year old female who presents to the clinic for follow-up visit.  She was last seen in this clinic on 12/30/2020 by Dr. Ernst Bowler for evaluation of asthma, allergic rhinitis, allergic conjunctivitis, atopic dermatitis, and food allergy to strawberry, egg, cinnamon, shrimp, and soy.  At today's visit, she reports her asthma has been moderately well controlled with occasional shortness of breath which is worse with activity and occasional cough producing mucus occurring in the morning.  She uses albuterol only on rare occasions and has not used Qvar since her last visit to this clinic.  She does report that when she uses albuterol her symptoms are relieved.  Allergic rhinitis is reported as moderately well controlled with symptoms including nasal congestion and postnasal drainage.  She does report that she continues cetirizine 10 mg once a day and is not currently using Flonase, azelastine, or nasal saline rinses.  She reports that she had been using saline nasal rinses with hot water from the tap, however, she heard of somebody getting a brain eating amoeba from using tap water and nasal rinse and has stopped using nasal rinses at this time.  Allergic conjunctivitis is reported as well controlled with no current medical intervention.  Atopic dermatitis is reported as moderately well controlled with only occasional scalp itching for which she uses Derma-Smoothe with relief of symptoms.   She continues to avoid cinnamon and soy and is currently eating strawberry, egg, and shrimp.  She does report feeling bloating and gas after eating egg, however, does not report any cardiopulmonary or integumentary symptoms and has not experienced vomiting or diarrhea after egg ingestion.  Her current medications are listed in the chart.   Drug Allergies:  Allergies  Allergen Reactions   Minocycline Swelling   Tea Tree Oil Swelling, Itching and Rash    Reaction to to tea tree oil, Facial swelling Reaction to to tea tree oil, Facial swelling    Other Itching    Strawberry, egg, soy, kiwi, muscadine   Penicillins Other (See Comments)    Causes yeast infection   Sulfa Antibiotics     Physical Exam: BP 124/78   Pulse 69   Temp 98.1 F (36.7 C)   Resp 18   Ht 5' 2.5" (1.588 m)   Wt 221 lb 4 oz (100.4 kg)   LMP 04/18/2017   SpO2 98%   BMI 39.82 kg/m    Physical Exam Vitals reviewed.  Constitutional:      Appearance: Normal appearance.  HENT:     Head: Normocephalic and atraumatic.     Right Ear: Tympanic membrane normal.     Left Ear: Tympanic membrane normal.     Mouth/Throat:     Pharynx: Oropharynx is clear.     Comments: Bilateral nares slightly erythematous with clear nasal drainage noted.  Pharynx normal.  Ears normal.  Eyes normal. Eyes:     Conjunctiva/sclera: Conjunctivae normal.  Cardiovascular:     Rate and Rhythm:  Normal rate and regular rhythm.     Heart sounds: Normal heart sounds. No murmur heard. Pulmonary:     Effort: Pulmonary effort is normal.     Breath sounds: Normal breath sounds.     Comments: Lungs clear to auscultation Musculoskeletal:        General: Normal range of motion.     Cervical back: Normal range of motion and neck supple.  Skin:    General: Skin is warm and dry.  Neurological:     Mental Status: She is alert and oriented to person, place, and time.  Psychiatric:        Mood and Affect: Mood normal.        Behavior: Behavior  normal.        Thought Content: Thought content normal.        Judgment: Judgment normal.     Diagnostics: FVC 2.27, FEV1 1.73.  Predicted FVC 2.96, predicted FEV1 2.38.  Spirometry indicates normal ventilatory function.  Postbronchodilator FVC 2.56, FEV1 1.99.  Postbronchodilator spirometry indicates 15% improvement in FEV1.  Assessment and Plan: 1. Mild intermittent asthma without complication   2. Seasonal and perennial allergic rhinitis   3. Anaphylaxis due to food, subsequent encounter   4. Allergic contact dermatitis due to cosmetics     Meds ordered this encounter  Medications   EPINEPHrine (AUVI-Q) 0.3 mg/0.3 mL IJ SOAJ injection    Sig: Inject 0.3 mg into the muscle as needed for anaphylaxis.    Dispense:  2 each    Refill:  1    Contact Number: [phone]    Patient Instructions  Asthma Continue albuterol 2 puffs once every 4 hours as needed for cough or wheeze You may use albuterol 2 puffs 5-15 minutes before activity to decrease cough or wheeze For asthma flare, begin Qvar 80-2 puffs twice a day and add montelukast 10 mg once a day for 2 weeks or until cough and wheeze free  Allergic rhinitis Continue allergen avoidance measures directed toward tree pollen, grass pollen, weed pollen, dust mites, and cockroach as listed below Continue cetirizine 10 mg once a day as needed for a runny nose or itch Continue azelastine 2 sprays in each nostril up to twice a day as needed for a runny nose Consider allergen immunotherapy if your symptoms are not well controlled with the treatment plan as listed above  Allergic contact dermatitis Continue to avoid products containing paraben, fragrance mix, and tea tree.  I will enter these allergies into the dermatologic web site and send the results to your email If your rash does not improve, you may begin triamcinolone 0.1% ointment twice a day as needed to red, itchy areas below your face  Food allergy Continue to avoid cinnamon  and soy.  In case of an allergic reaction, take Benadryl 50 mg every 4 hours, and if life-threatening symptoms occur, inject with AuviQ 0.3 mg.  Call the clinic if this treatment plan is not working well for you  Follow up in 6 months or sooner if needed.   Return in about 6 months (around 01/09/2022), or if symptoms worsen or fail to improve.    Thank you for the opportunity to care for this patient.  Please do not hesitate to contact me with questions.  Gareth Morgan, FNP [facility]

## 2021-07-10 NOTE — Patient Instructions (Addendum)
Asthma Continue albuterol 2 puffs once every 4 hours as needed for cough or wheeze You may use albuterol 2 puffs 5-15 minutes before activity to decrease cough or wheeze For asthma flare, begin Qvar 80-2 puffs twice a day and add montelukast 10 mg once a day for 2 weeks or until cough and wheeze free  Allergic rhinitis Continue allergen avoidance measures directed toward tree pollen, grass pollen, weed pollen, dust mites, and cockroach as listed below Continue cetirizine 10 mg once a day as needed for a runny nose or itch Continue azelastine 2 sprays in each nostril up to twice a day as needed for a runny nose Consider allergen immunotherapy if your symptoms are not well controlled with the treatment plan as listed above  Allergic contact dermatitis Continue to avoid products containing paraben, fragrance mix, and tea tree.  I will enter these allergies into the dermatologic web site and send the results to your email If your rash does not improve, you may begin triamcinolone 0.1% ointment twice a day as needed to red, itchy areas below your face  Food allergy Continue to avoid cinnamon and soy.  In case of an allergic reaction, take Benadryl 50 mg every 4 hours, and if life-threatening symptoms occur, inject with AuviQ 0.3 mg.  Call the clinic if this treatment plan is not working well for you  Follow up in 6 months or sooner if needed.  Reducing Pollen Exposure The American Academy of Allergy, Asthma and Immunology suggests the following steps to reduce your exposure to pollen during allergy seasons. Do not hang sheets or clothing out to dry; pollen may collect on these items. Do not mow lawns or spend time around freshly cut grass; mowing stirs up pollen. Keep windows closed at night.  Keep car windows closed while driving. Minimize morning activities outdoors, a time when pollen counts are usually at their highest. Stay indoors as much as possible when pollen counts or humidity is  high and on windy days when pollen tends to remain in the air longer. Use air conditioning when possible.  Many air conditioners have filters that trap the pollen spores. Use a HEPA room air filter to remove pollen form the indoor air you breathe.   Control of Dust Mite Allergen Dust mites play a major role in allergic asthma and rhinitis. They occur in environments with high humidity wherever human skin is found. Dust mites absorb humidity from the atmosphere (ie, they do not drink) and feed on organic matter (including shed human and animal skin). Dust mites are a microscopic type of insect that you cannot see with the naked eye. High levels of dust mites have been detected from mattresses, pillows, carpets, upholstered furniture, bed covers, clothes, soft toys and any woven material. The principal allergen of the dust mite is found in its feces. A gram of dust may contain 1,000 mites and 250,000 fecal particles. Mite antigen is easily measured in the air during house cleaning activities. Dust mites do not bite and do not cause harm to humans, other than by triggering allergies/asthma.  Ways to decrease your exposure to dust mites in your home:  1. Encase mattresses, box springs and pillows with a mite-impermeable barrier or cover  2. Wash sheets, blankets and drapes weekly in hot water (130 F) with detergent and dry them in a dryer on the hot setting.  3. Have the room cleaned frequently with a vacuum cleaner and a damp dust-mop. For carpeting or rugs, vacuuming with a  vacuum cleaner equipped with a high-efficiency particulate air (HEPA) filter. The dust mite allergic individual should not be in a room which is being cleaned and should wait 1 hour after cleaning before going into the room.  4. Do not sleep on upholstered furniture (eg, couches).  5. If possible removing carpeting, upholstered furniture and drapery from the home is ideal. Horizontal blinds should be eliminated in the rooms where  the person spends the most time (bedroom, study, television room). Washable vinyl, roller-type shades are optimal.  6. Remove all non-washable stuffed toys from the bedroom. Wash stuffed toys weekly like sheets and blankets above.  7. Reduce indoor humidity to less than 50%. Inexpensive humidity monitors can be purchased at most hardware stores. Do not use a humidifier as can make the problem worse and are not recommended.  Control of Cockroach Allergen Cockroach allergen has been identified as an important cause of acute attacks of asthma, especially in urban settings.  There are fifty-five species of cockroach that exist in the [place], however only three, the [place], [place] species produce allergen that can affect patients with Asthma.  Allergens can be obtained from fecal particles, egg casings and secretions from cockroaches.    Remove food sources. Reduce access to water. Seal access and entry points. Spray runways with 0.5-1% Diazinon or Chlorpyrifos Blow boric acid power under stoves and refrigerator. Place bait stations (hydramethylnon) at feeding sites.

## 2021-07-17 ENCOUNTER — Other Ambulatory Visit: Payer: Self-pay

## 2021-07-17 MED ORDER — AUVI-Q 0.3 MG/0.3ML IJ SOAJ: 0.3000 mg | INTRAMUSCULAR | 1 refills | Status: DC | PRN

## 2021-11-09 ENCOUNTER — Ambulatory Visit: Payer: 59 | Admitting: Obstetrics & Gynecology

## 2022-01-10 ENCOUNTER — Encounter: Payer: Self-pay | Admitting: Allergy & Immunology

## 2022-01-10 ENCOUNTER — Ambulatory Visit (INDEPENDENT_AMBULATORY_CARE_PROVIDER_SITE_OTHER): Payer: Managed Care, Other (non HMO) | Admitting: Allergy & Immunology

## 2022-01-10 VITALS — BP 118/72 | HR 96 | Temp 98.0°F | Resp 16 | Ht 63.5 in | Wt 222.2 lb

## 2022-01-10 DIAGNOSIS — T7800XD Anaphylactic reaction due to unspecified food, subsequent encounter: Secondary | ICD-10-CM | POA: Diagnosis not present

## 2022-01-10 DIAGNOSIS — J4520 Mild intermittent asthma, uncomplicated: Secondary | ICD-10-CM

## 2022-01-10 DIAGNOSIS — J3089 Other allergic rhinitis: Secondary | ICD-10-CM | POA: Diagnosis not present

## 2022-01-10 DIAGNOSIS — L232 Allergic contact dermatitis due to cosmetics: Secondary | ICD-10-CM

## 2022-01-10 DIAGNOSIS — J302 Other seasonal allergic rhinitis: Secondary | ICD-10-CM

## 2022-01-10 MED ORDER — CETIRIZINE HCL 10 MG PO TABS: 10.0000 mg | ORAL_TABLET | Freq: Every day | ORAL | 2 refills | Status: AC | PRN

## 2022-01-10 MED ORDER — AUVI-Q 0.3 MG/0.3ML IJ SOAJ: 0.3000 mg | INTRAMUSCULAR | 1 refills | Status: DC | PRN

## 2022-01-10 MED ORDER — ALBUTEROL SULFATE HFA 108 (90 BASE) MCG/ACT IN AERS: 2.0000 | INHALATION_SPRAY | RESPIRATORY_TRACT | 1 refills | Status: DC | PRN

## 2022-01-10 NOTE — Progress Notes (Signed)
FOLLOW UP  Date of Service/Encounter:  01/10/22   Assessment:   Mild persistent asthma, uncomplicated   Seasonal and perennial allergic rhinitis (trees, weeds, grasses, dust mites and cockroach)   Allergic contact dermatitis (parabens, fragrance mix, tea tree)   Adverse food reaction (strawberries, egg, soy)   Poor compliance - secondary to preference for holistic therapies  [employer] fanatic  Plan/Recommendations:   1. Seasonal and perennial allergic rhinitis (trees, weeds, grasses, dust mites and cockroach) - Continue with: Zyrtec (cetirizine) '10mg'$  tablet once daily  - STOP taking: the Astelin up to two sprays per nostril twice daily AS NEEDED during spring - STOP taking: a nasal steroid one spray per nostril twice daily AS NEEDED during the spring  - Use nasal saline rinses (NeilMed or Netti Pot) 1-2 times daily to move allergens and thin out mucous.  - Consider allergy shots as a means of long-term control.   2. Mild persistent asthma, uncomplicated - Lung function looked stable today.  =- STOP montelukast.  - Daily controller medication(s): NOTHING - Prior to physical activity: albuterol 2 puffs 10-15 minutes before physical activity (if this is a trigger) - Rescue medications: albuterol 4 puffs every 4-6 hours as needed or albuterol nebulizer one vial every 4-6 hours as needed - Changes during respiratory infections or worsening symptoms: Add on Qvar 60mg to 2 puffs three times daily and Singulair '10mg'$  for ONE TO TWO WEEKS. - Asthma control goals:  * Full participation in all desired activities (may need albuterol before activity) * Albuterol use two time or less a week on average (not counting use with activity) * Cough interfering with sleep two time or less a month * Oral steroids no more than once a year * No hospitalizations   3. Allergic contact dermatitis - controlled - It looks like you have your cosmetics all figured out. - Continue to use these products  and avoid your other triggers.   4. 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  - Epinephrine is up to date.   5. Return in about 1 year (around 01/11/2023).    Subjective:   Hailey Lam a 51y.o. female presenting today for follow up of  Chief Complaint  Patient presents with   Asthma    No issues or concerns at this time.     Hailey Vandevenhas a history of the following: Patient Active Problem List   Diagnosis Date Noted   Mild intermittent asthma without complication 037/90/2409  Anaphylaxis due to food, subsequent encounter 07/10/2021   Bloating 02/04/2020   IBS (irritable bowel syndrome) 02/04/2020   Abdominal pain 02/04/2020   Multinodular goiter 06/20/2018   Seasonal and perennial allergic rhinitis 04/25/2017   Mild persistent asthma without complication 073/53/2992  Allergic contact dermatitis due to cosmetics 04/25/2017   Adverse food reaction 04/25/2017   Encounter for routine gynecological examination 08/19/2013   Edema 04/14/2013   Neuropathy 04/14/2013   Atypical chest pain 04/14/2013    History obtained from: chart review and patient.  CXaniyahis a 51y.o. female presenting for a follow up visit.  She was last seen in December 2022.  At that time, we continue with Zyrtec as well as Astelin and a nasal steroid as needed.  Her lung function looked very stable.  We continued with albuterol as needed.  She had Qvar that she added during flares.  Her contact dermatitis was under good control.  She  continued to avoid strawberries, egg, and cinnamon.  Since last visit, she has done well. She has not been on any drives since the pandemic. Her husband has a 2020 Jeep.   Asthma/Respiratory Symptom History: Asthma has been well controlled.  She has not needed albuterol in a while. She thinks that this is only used when the weather changes, especially summer into fall. This is always a bad time of the year for her  symptoms. She has not needed her Qvar. She does not think that she got that filled. She uses the Ventolin twice per year.   Allergic Rhinitis Symptom History: She is using cetirizine daily. She stopped the montelukast a while ago.  She does not feel any worse without this on board.   Food Allergy Symptom History: She continues to avoid strawberries, egg, soy. She has not needed to use her EpiPen at all. She has not tried experimenting with introducing these into her diet.   Skin Symptom History: She has done well with her skin until this past week. She has a lesion on the left side of her face that is affecting. She has tried putting on alcohol with some mild improvement in the symptoms. It  never drained at all.  She did use the Dermosmooth since that seemed to make her scalp[p more dry than normal. She stopped her montelukast which caused her to have an attitude. She tended to take it in the evening and would get aggressive, so she stopped it and her symptoms improved.   She is on Slynd to use as an OCP.   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 She hates people and likes not having to talk to them. She has not been joy riding with her husband, who has a [place].   Otherwise, there have been no changes to her past medical history, surgical history, family history, or social history.    Review of Systems  Constitutional: Negative.  Negative for chills, fever, malaise/fatigue and weight loss.  HENT:  Positive for congestion. Negative for ear discharge, ear pain and sinus pain.   Eyes:  Negative for pain, discharge and redness.  Respiratory:  Negative for cough, sputum production, shortness of breath and wheezing.   Cardiovascular: Negative.  Negative for chest pain and palpitations.  Gastrointestinal:  Negative for abdominal pain, constipation, diarrhea, heartburn, nausea and vomiting.  Skin:  Positive for rash. Negative for itching.  Neurological:   Negative for dizziness and headaches.  Endo/Heme/Allergies:  Positive for environmental allergies. Does not bruise/bleed easily.       Objective:   Blood pressure 118/72, pulse 96, temperature 98 F (36.7 C), temperature source Temporal, resp. rate 16, height 5' 3.5" (1.613 m), weight 222 lb 3.2 oz (100.8 kg), last menstrual period 04/18/2017, SpO2 96 %. Body mass index is 38.74 kg/m.    Physical Exam Vitals reviewed.  Constitutional:      Appearance: Normal appearance. She is well-developed.  HENT:     Head: Normocephalic and atraumatic.     Right Ear: Tympanic membrane, ear canal and external ear normal.     Left Ear: Tympanic membrane, ear canal and external ear normal.     Nose: No nasal deformity, septal deviation, mucosal edema or rhinorrhea.     Right Turbinates: Enlarged, swollen and pale.     Left Turbinates: Enlarged, swollen and pale.     Right Sinus: No maxillary sinus tenderness or frontal sinus tenderness.  Left Sinus: No maxillary sinus tenderness or frontal sinus tenderness.     Mouth/Throat:     Mouth: Mucous membranes are not pale and not dry.     Pharynx: Uvula midline.  Eyes:     General: Lids are normal. No allergic shiner.       Right eye: No discharge.        Left eye: No discharge.     Conjunctiva/sclera: Conjunctivae normal.     Right eye: Right conjunctiva is not injected. No chemosis.    Left eye: Left conjunctiva is not injected. No chemosis.    Pupils: Pupils are equal, round, and reactive to light.  Cardiovascular:     Rate and Rhythm: Normal rate and regular rhythm.     Heart sounds: Normal heart sounds.  Pulmonary:     Effort: Pulmonary effort is normal. No tachypnea, accessory muscle usage or respiratory distress.     Breath sounds: Normal breath sounds. No wheezing, rhonchi or rales.     Comments: Moving air well in all lung fields.  Chest:     Chest wall: No tenderness.  Lymphadenopathy:     Cervical: No cervical adenopathy.   Skin:    General: Skin is warm.     Capillary Refill: Capillary refill takes less than 2 seconds.     Coloration: Skin is not pale.     Findings: No abrasion, erythema, petechiae or rash. Rash is not papular, urticarial or vesicular.     Comments: Skin looks fantastic.   Neurological:     Mental Status: She is alert.  Psychiatric:        Behavior: Behavior is cooperative.      Diagnostic studies:    Spirometry: results normal (FEV1: 1.87/81%, FVC: 2.82/99%, FEV1/FVC: 66%).    Spirometry consistent with mild obstructive disease.    Allergy Studies: none        Salvatore Marvel, MD  [facility]

## 2022-01-10 NOTE — Patient Instructions (Addendum)
1. Seasonal and perennial allergic rhinitis (trees, weeds, grasses, dust mites and cockroach) - Continue with: Zyrtec (cetirizine) '10mg'$  tablet once daily  - STOP taking: the Astelin up to two sprays per nostril twice daily AS NEEDED during spring - STOP taking: a nasal steroid one spray per nostril twice daily AS NEEDED during the spring  - Use nasal saline rinses (NeilMed or Netti Pot) 1-2 times daily to move allergens and thin out mucous.  - Consider allergy shots as a means of long-term control.   2. Mild persistent asthma, uncomplicated - Lung function looked stable today.  =- STOP montelukast.  - Daily controller medication(s): NOTHING - Prior to physical activity: albuterol 2 puffs 10-15 minutes before physical activity (if this is a trigger) - Rescue medications: albuterol 4 puffs every 4-6 hours as needed or albuterol nebulizer one vial every 4-6 hours as needed - Changes during respiratory infections or worsening symptoms: Add on Qvar 18mg to 2 puffs three times daily and Singulair '10mg'$  for ONE TO TWO WEEKS. - Asthma control goals:  * Full participation in all desired activities (may need albuterol before activity) * Albuterol use two time or less a week on average (not counting use with activity) * Cough interfering with sleep two time or less a month * Oral steroids no more than once a year * No hospitalizations   3. Allergic contact dermatitis - controlled - It looks like you have your cosmetics all figured out. - Continue to use these products and avoid your other triggers.   4. 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  - Epinephrine is up to date.   5. Return in about 1 year (around 01/11/2023).   Please inform [facility] any Emergency Department visits, hospitalizations, or changes in symptoms. Call [facility] going to the ED for breathing or allergy symptoms since we might be able to fit you in for a sick visit.  Feel free to contact [facility] with any questions, problems, or concerns.  It was a pleasure to see you again today!  Websites that have reliable patient information: 1. American Academy of Asthma, Allergy, and Immunology: www.aaaai.org 2. Food Allergy Research and Education (FARE): foodallergy.org 3. Mothers of Asthmatics: http://www.asthmacommunitynetwork.org 4. American College of Allergy, Asthma, and Immunology: www.acaai.org   COVID-19 Vaccine Information can be found at: [email or web address] questions related to vaccine distribution or appointments, please email [email or web address] or call [phone]     "Like" [facility] Facebook and Instagram for our latest updates!        Make sure you are registered to vote! If you have moved or changed any of your contact information, you will need to get this updated before voting!  In some cases, you MAY be able to register to vote online: [email or web address]   Allergy Shots   Allergies are the result of a chain reaction that starts in the immune system. Your immune system controls how your body defends itself. For instance, if you have an allergy to pollen, your immune system identifies pollen as an invader or allergen. Your immune system overreacts by producing antibodies called Immunoglobulin E (IgE). These antibodies travel to cells that release chemicals, causing an allergic reaction.  The concept behind allergy immunotherapy, whether it is received in the form of shots or tablets, is that the immune system can be desensitized to specific allergens that trigger allergy symptoms. Although it requires time and patience, the payback can be long-term relief.  How Do Allergy Shots Work?  Allergy shots work much like a vaccine. Your body responds to injected amounts of a particular allergen given in increasing doses, eventually developing a  resistance and tolerance to it. Allergy shots can lead to decreased, minimal or no allergy symptoms.  There generally are two phases: build-up and maintenance. Build-up often ranges from three to six months and involves receiving injections with increasing amounts of the allergens. The shots are typically given once or twice a week, though more rapid build-up schedules are sometimes used.  The maintenance phase begins when the most effective dose is reached. This dose is different for each person, depending on how allergic you are and your response to the build-up injections. Once the maintenance dose is reached, there are longer periods between injections, typically two to four weeks.  Occasionally doctors give cortisone-type shots that can temporarily reduce allergy symptoms. These types of shots are different and should not be confused with allergy immunotherapy shots.  Who Can Be Treated with Allergy Shots?  Allergy shots may be a good treatment approach for people with allergic rhinitis (hay fever), allergic asthma, conjunctivitis (eye allergy) or stinging insect allergy.   Before deciding to begin allergy shots, you should consider:   The length of allergy season and the severity of your symptoms  Whether medications and/or changes to your environment can control your symptoms  Your desire to avoid long-term medication use  Time: allergy immunotherapy requires a major time commitment  Cost: may vary depending on your insurance coverage  Allergy shots for children age 58 and older are effective and often well tolerated. They might prevent the onset of new allergen sensitivities or the progression to asthma.  Allergy shots are not started on patients who are pregnant but can be continued on patients who become pregnant while receiving them. In some patients with other medical conditions or who take certain common medications, allergy shots may be of risk. It is important to mention other  medications you talk to your allergist.   When Will I Feel Better?  Some may experience decreased allergy symptoms during the build-up phase. For others, it may take as long as 12 months on the maintenance dose. If there is no improvement after a year of maintenance, your allergist will discuss other treatment options with you.  If you aren't responding to allergy shots, it may be because there is not enough dose of the allergen in your vaccine or there are missing allergens that were not identified during your allergy testing. Other reasons could be that there are high levels of the allergen in your environment or major exposure to non-allergic triggers like tobacco smoke.  What Is the Length of Treatment?  Once the maintenance dose is reached, allergy shots are generally continued for three to five years. The decision to stop should be discussed with your allergist at that time. Some people may experience a permanent reduction of allergy symptoms. Others may relapse and a longer course of allergy shots can be considered.  What Are the Possible Reactions?  The two types of adverse reactions that can occur with allergy shots are local and systemic. Common local reactions include very mild redness and swelling at the injection site, which can happen immediately or several hours after. A systemic reaction, which is less common, affects the entire body or a particular body system. They are usually mild and typically respond quickly to medications. Signs include increased allergy symptoms such as sneezing, a stuffy nose or  hives.  Rarely, a serious systemic reaction called anaphylaxis can develop. Symptoms include swelling in the throat, wheezing, a feeling of tightness in the chest, nausea or dizziness. Most serious systemic reactions develop within 30 minutes of allergy shots. This is why it is strongly recommended you wait in your doctor's office for 30 minutes after your injections. Your allergist is  trained to watch for reactions, and his or her staff is trained and equipped with the proper medications to identify and treat them.  Who Should Administer Allergy Shots?  The preferred location for receiving shots is your prescribing allergist's office. Injections can sometimes be given at another facility where the physician and staff are trained to recognize and treat reactions, and have received instructions by your prescribing allergist.

## 2022-09-14 ENCOUNTER — Other Ambulatory Visit: Payer: Self-pay

## 2022-09-14 MED ORDER — ALBUTEROL SULFATE HFA 108 (90 BASE) MCG/ACT IN AERS: 2.0000 | INHALATION_SPRAY | RESPIRATORY_TRACT | 1 refills | Status: DC | PRN

## 2022-09-14 NOTE — Telephone Encounter (Signed)
Received a fax from [facility] for refill of Albuterol. Rx has been sent and form has been faxed to [facility].

## 2022-11-29 ENCOUNTER — Ambulatory Visit: Payer: Managed Care, Other (non HMO) | Admitting: Orthopedic Surgery

## 2023-01-09 ENCOUNTER — Ambulatory Visit (INDEPENDENT_AMBULATORY_CARE_PROVIDER_SITE_OTHER): Payer: Managed Care, Other (non HMO) | Admitting: Allergy & Immunology

## 2023-01-09 ENCOUNTER — Encounter: Payer: Self-pay | Admitting: Allergy & Immunology

## 2023-01-09 VITALS — BP 92/62 | HR 88 | Temp 98.0°F | Resp 20 | Ht 62.21 in | Wt 218.2 lb

## 2023-01-09 DIAGNOSIS — L232 Allergic contact dermatitis due to cosmetics: Secondary | ICD-10-CM | POA: Diagnosis not present

## 2023-01-09 DIAGNOSIS — J3089 Other allergic rhinitis: Secondary | ICD-10-CM | POA: Diagnosis not present

## 2023-01-09 DIAGNOSIS — T7800XD Anaphylactic reaction due to unspecified food, subsequent encounter: Secondary | ICD-10-CM

## 2023-01-09 DIAGNOSIS — J4520 Mild intermittent asthma, uncomplicated: Secondary | ICD-10-CM

## 2023-01-09 DIAGNOSIS — J302 Other seasonal allergic rhinitis: Secondary | ICD-10-CM

## 2023-01-09 NOTE — Progress Notes (Signed)
FOLLOW UP  Date of Service/Encounter:  01/09/23   Assessment:   Mild persistent asthma, uncomplicated   Seasonal and perennial allergic rhinitis (trees, weeds, grasses, dust mites and cockroach)   Allergic contact dermatitis (parabens, fragrance mix, tea tree)   Adverse food reaction (egg, soy)   Poor compliance - secondary to preference for holistic therapies   [employer] fanatic    Plan/Recommendations:   Patient Instructions  1. Seasonal and perennial allergic rhinitis (trees, weeds, grasses, dust mites and cockroach) - Continue with: Zyrtec (cetirizine) 10mg  tablet once daily  - STOP taking: the Astelin up to two sprays per nostril twice daily AS NEEDED during spring - STOP taking: a nasal steroid one spray per nostril twice daily AS NEEDED during the spring  - Use nasal saline rinses (NeilMed or Netti Pot) 1-2 times daily to move allergens and thin out mucous.  - Consider allergy shots as a means of long-term control.   2. Mild persistent asthma, uncomplicated - Lung function looked stable today.  - STOP montelukast.  - Daily controller medication(s): NOTHING - Prior to physical activity: albuterol 2 puffs 10-15 minutes before physical activity (if this is a trigger) - Rescue medications: albuterol 4 puffs every 4-6 hours as needed or albuterol nebulizer one vial every 4-6 hours as needed - Changes during respiratory infections or worsening symptoms: Add on Qvar to 2 puffs three times daily for ONE TO TWO WEEKS. - Asthma control goals:  * Full participation in all desired activities (may need albuterol before activity) * Albuterol use two time or less a week on average (not counting use with activity) * Cough interfering with sleep two time or less a month * Oral steroids no more than once a year * No hospitalizations   3. Allergic contact dermatitis - controlled - It looks like you have your cosmetics all figured out. - Continue to use these products and  avoid your other triggers.  - Continue with the See Me product daily for your face.   4. 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 - Epinephrine is up to date.  - Labs ordered to look for these food triggers.   5. Return in about 1 year (around 01/09/2024). You can have the follow up appointment with Dr. Dellis Lam or a Nurse Practicioner (our Nurse Practitioners are excellent and always have Physician oversight!).    Please inform us of any Emergency Department visits, hospitalizations, or changes in symptoms. Call us before going to the ED for breathing or allergy symptoms since we might be able to fit you in for a sick visit. Feel free to contact us anytime with any questions, problems, or concerns.  It was a pleasure to see you again today!  Websites that have reliable patient information: 1. American Academy of Asthma, Allergy, and Immunology: www.aaaai.org 2. Food Allergy Research and Education (FARE): foodallergy.org 3. Mothers of Asthmatics: http://www.asthmacommunitynetwork.org 4. American College of Allergy, Asthma, and Immunology: www.acaai.org      "Like" [name] on Facebook and Instagram for our latest updates!      A healthy democracy works best when [employer] participate! Make sure you are registered to vote! If you have moved or changed any of your contact information, you will need to get this updated before voting! Scan the QR codes below to learn more!             Subjective:   Hailey Lam is a  52 y.o. female presenting today for follow up of  Chief Complaint  Patient presents with   Follow-up    Swelling around the mounjaro injection site when she updosed to 5mg     Hailey Lam has a history of the following: Patient Active Problem List   Diagnosis Date Noted   Mild intermittent asthma without complication 07/10/2021   Anaphylaxis due to food, subsequent encounter 07/10/2021    Bloating 02/04/2020   IBS (irritable bowel syndrome) 02/04/2020   Abdominal pain 02/04/2020   Multinodular goiter 06/20/2018   Seasonal and perennial allergic rhinitis 04/25/2017   Mild persistent asthma without complication 04/25/2017   Allergic contact dermatitis due to cosmetics 04/25/2017   Adverse food reaction 04/25/2017   Encounter for routine gynecological examination 08/19/2013   Edema 04/14/2013   Neuropathy 04/14/2013   Atypical chest pain 04/14/2013    History obtained from: chart review and patient.  Discussed the use of AI scribe software for clinical note transcription with the patient and/or guardian, who gave verbal consent to proceed.  Hailey Lam is a 52 y.o. female presenting for a follow up visit. She was last seen in December 2023. At that time, we stopped the montelukast to see how she did. We continued with albuterol as needed and Qvar added during respiratory flares. For his rhinitis, we continued with the cetirizine and stopped her nasal sprays completely.we did discuss allergy shots for long term control. For her ACD, she was doing well with the cosmetic avoidance. She continued to avoid strawberries, egg, and cinnamon.   Since the last visit, she has done well.   Asthma/Respiratory Symptom History: The patient's asthma is reportedly well-controlled, and she has not needed to use her albuterol inhaler recently.  Hailey Lam's asthma has been well controlled. She has not required rescue medication, experienced nocturnal awakenings due to lower respiratory symptoms, nor have activities of daily living been limited. She has required no Emergency Department or Urgent Care visits for her asthma. She has required zero courses of systemic steroids for asthma exacerbations since the last visit. ACT score today is 25, indicating excellent asthma symptom control.   Allergic Rhinitis Symptom History: Hailey Lam presents with complaints of nasal congestion and rhinorrhea, which she  attributes to the use of a new CPAP machine. She reports that the symptoms began after starting the CPAP machine, which she uses with a humidifier. She has tried adjusting the temperature and humidity settings on the machine, but she either wakes up with a headache from cold air or with excessive nasal secretions. She has been managing the nasal symptoms with Aquaphor.  Food Allergy Symptom History: The patient also reports a history of food allergies, including strawberries, eggs, cinnamon, and soy, which cause various symptoms ranging from gastrointestinal upset to oral itching. She also reports new onset reactions to oranges and kiwi, which cause oral itching and a sensation of acid burn, respectively. She has been avoiding these foods due to the reactions. She is open to repeat testing today.   Skin Symptom History: She also reports a history of skin issues, which have improved with the use of a specific handmade product.  She continues to avoid all of her triggering chemicals. She purchases this product off  of [employer] an she guys extras as gifts. She is praying that this person does not stop making this product.   Otherwise, there have been no changes to her past medical history, surgical history, family history, or social history.    Review of systems otherwise negative  other than that mentioned in the HPI.    Objective:   Blood pressure 92/62, pulse 88, temperature 98 F (36.7 C), resp. rate 20, height 5' 2.21" (1.58 m), weight 218 lb 4 oz (99 kg), last menstrual period 04/18/2017, SpO2 97%. Body mass index is 39.66 kg/m.    Physical Exam Vitals reviewed.  Constitutional:      Appearance: Normal appearance. She is well-developed.     Comments: Very lovely. Smiling. Talkative.   HENT:     Head: Normocephalic and atraumatic.     Right Ear: Tympanic membrane, ear canal and external ear normal.     Left Ear: Tympanic membrane, ear canal and external ear normal.     Nose: No nasal  deformity, septal deviation, mucosal edema or rhinorrhea.     Right Turbinates: Enlarged, swollen and pale.     Left Turbinates: Enlarged, swollen and pale.     Right Sinus: No maxillary sinus tenderness or frontal sinus tenderness.     Left Sinus: No maxillary sinus tenderness or frontal sinus tenderness.     Mouth/Throat:     Mouth: Mucous membranes are not pale and not dry.     Pharynx: Uvula midline.  Eyes:     General: Lids are normal. No allergic shiner.       Right eye: No discharge.        Left eye: No discharge.     Conjunctiva/sclera: Conjunctivae normal.     Right eye: Right conjunctiva is not injected. No chemosis.    Left eye: Left conjunctiva is not injected. No chemosis.    Pupils: Pupils are equal, round, and reactive to light.  Cardiovascular:     Rate and Rhythm: Normal rate and regular rhythm.     Heart sounds: Normal heart sounds.  Pulmonary:     Effort: Pulmonary effort is normal. No tachypnea, accessory muscle usage or respiratory distress.     Breath sounds: Normal breath sounds. No wheezing, rhonchi or rales.     Comments: Moving air well in all lung fields.  Chest:     Chest wall: No tenderness.  Lymphadenopathy:     Cervical: No cervical adenopathy.  Skin:    General: Skin is warm.     Capillary Refill: Capillary refill takes less than 2 seconds.     Coloration: Skin is not pale.     Findings: No abrasion, erythema, petechiae or rash. Rash is not papular, urticarial or vesicular.     Comments: Skin looks fantastic.   Neurological:     Mental Status: She is alert.  Psychiatric:        Behavior: Behavior is cooperative.      Diagnostic studies:    Spirometry: results normal (FEV1: 2.13/97%, FVC: 3.07/113%, FEV1/FVC: 69%).    Spirometry consistent with mild obstructive disease. This is stable compared to her previous spirometric findings.   Allergy Studies: none       Malachi Bonds, MD  [facility]

## 2023-01-09 NOTE — Patient Instructions (Addendum)
1. Seasonal and perennial allergic rhinitis (trees, weeds, grasses, dust mites and cockroach) - Continue with: Zyrtec (cetirizine) 10mg  tablet once daily  - STOP taking: the Astelin up to two sprays per nostril twice daily AS NEEDED during spring - STOP taking: a nasal steroid one spray per nostril twice daily AS NEEDED during the spring  - Use nasal saline rinses (NeilMed or Netti Pot) 1-2 times daily to move allergens and thin out mucous.  - Consider allergy shots as a means of long-term control.   2. Mild persistent asthma, uncomplicated - Lung function looked stable today.  - STOP montelukast.  - Daily controller medication(s): NOTHING - Prior to physical activity: albuterol 2 puffs 10-15 minutes before physical activity (if this is a trigger) - Rescue medications: albuterol 4 puffs every 4-6 hours as needed or albuterol nebulizer one vial every 4-6 hours as needed - Changes during respiratory infections or worsening symptoms: Add on Qvar to 2 puffs three times daily for ONE TO TWO WEEKS. - Asthma control goals:  * Full participation in all desired activities (may need albuterol before activity) * Albuterol use two time or less a week on average (not counting use with activity) * Cough interfering with sleep two time or less a month * Oral steroids no more than once a year * No hospitalizations   3. Allergic contact dermatitis - controlled - It looks like you have your cosmetics all figured out. - Continue to use these products and avoid your other triggers.  - Continue with the See Me product daily for your face.   4. 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 - Epinephrine is up to date.  - Labs ordered to look for these food triggers.   5. Return in about 1 year (around 01/09/2024). You can have the follow up appointment with Dr. Dellis Anes or a Nurse Practicioner (our Nurse Practitioners are excellent and  always have Physician oversight!).    Please inform us of any Emergency Department visits, hospitalizations, or changes in symptoms. Call us before going to the ED for breathing or allergy symptoms since we might be able to fit you in for a sick visit. Feel free to contact us anytime with any questions, problems, or concerns.  It was a pleasure to see you again today!  Websites that have reliable patient information: 1. American Academy of Asthma, Allergy, and Immunology: www.aaaai.org 2. Food Allergy Research and Education (FARE): foodallergy.org 3. Mothers of Asthmatics: http://www.asthmacommunitynetwork.org 4. American College of Allergy, Asthma, and Immunology: www.acaai.org      "Like" [name] on Facebook and Instagram for our latest updates!      A healthy democracy works best when [employer] participate! Make sure you are registered to vote! If you have moved or changed any of your contact information, you will need to get this updated before voting! Scan the QR codes below to learn more!

## 2023-09-16 LAB — LAB REPORT - SCANNED: EGFR: 56

## 2023-09-17 LAB — LAB REPORT - SCANNED
Albumin, Urine POC: 10.2
Creatinine, POC: 157.1 mg/dL
Microalb Creat Ratio: 6

## 2023-11-12 LAB — LAB REPORT - SCANNED: EGFR: 66

## 2023-12-09 NOTE — Progress Notes (Signed)
  Cardiology Office Note:   Date:  12/09/2023  ID:  Hailey Lam, DOB 02-04-1970, MRN [id number] PCP: Tobie Guy, DO  [facility] Providers Cardiologist:  None { Chief Complaint: No chief complaint on file.     History of Present Illness:   Hailey Lam is a 53 y.o. female with a PMH of HTN, OSA, and obesity who presents as a new patient referral by Lang Romano for the evaluation of a heart murmur.  The patient was seen remotely by cardiology in 2015.  She underwent a stress echo which was low risk for ischemia in 2013.  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  Has not seen cardiology since.   Past Medical History:  Diagnosis Date   Angio-edema    Asthma    Chest pain    Complication of anesthesia    PONV   Dizziness    Family history of diabetes mellitus    Family history of hypertension    Heart murmur    History of palpitations    Mitral valve regurgitation    Recurrent upper respiratory infection (URI)    Seasonal allergies      Studies Reviewed:    EKG: ***           Risk Assessment/Calculations:   {Does this patient have ATRIAL FIBRILLATION?:[phone]} No BP recorded.  {Refresh Note OR Click here to enter BP  :1}***        Physical Exam:     VS:  LMP 04/18/2017  ***    Wt Readings from Last 3 Encounters:  01/09/23 218 lb 4 oz (99 kg)  01/10/22 222 lb 3.2 oz (100.8 kg)  07/10/21 221 lb 4 oz (100.4 kg)     GEN: Well nourished, well developed, in no acute distress NECK: No JVD; No carotid bruits CARDIAC: ***RRR, no murmurs, rubs, gallops RESPIRATORY:  Clear to auscultation without rales, wheezing or rhonchi  ABDOMEN: Soft, non-tender, non-distended, normal bowel sounds EXTREMITIES:  Warm and well perfused, no edema; No deformity, 2+ radial pulses PSYCH: Normal mood and affect   Assessment & Plan       {Are you ordering a CV Procedure (e.g. stress test, cath, DCCV, TEE, etc)?   Press F2        :[id number]}   This note was  written with the assistance of a dictation microphone or AI dictation software. Please excuse any typos or grammatical errors.   Signed, Georganna Archer, MD 12/09/2023 7:00 PM    [facility]

## 2023-12-10 ENCOUNTER — Ambulatory Visit
Attending: Student in an Organized Health Care Education/Training Program | Admitting: Student in an Organized Health Care Education/Training Program

## 2023-12-10 ENCOUNTER — Encounter: Payer: Self-pay | Admitting: Student in an Organized Health Care Education/Training Program

## 2023-12-10 VITALS — BP 100/70 | HR 79 | Ht 63.0 in | Wt 172.0 lb

## 2023-12-10 DIAGNOSIS — R011 Cardiac murmur, unspecified: Secondary | ICD-10-CM

## 2023-12-10 DIAGNOSIS — Z13220 Encounter for screening for lipoid disorders: Secondary | ICD-10-CM

## 2023-12-10 DIAGNOSIS — I341 Nonrheumatic mitral (valve) prolapse: Secondary | ICD-10-CM | POA: Diagnosis not present

## 2023-12-10 NOTE — Patient Instructions (Signed)
 Medication Instructions:  No medication changes were made at this visit. Continue current regimen.   *If you need a refill on your cardiac medications before your next appointment, please call your pharmacy*  Lab Work: To be completed today: lipoprotein-a  If you have labs (blood work) drawn today and your tests are completely normal, you will receive your results only by: MyChart Message (if you have MyChart) OR A paper copy in the mail If you have any lab test that is abnormal or we need to change your treatment, we will call you to review the results.  Testing/Procedures: Your physician has requested that you have an echocardiogram. Echocardiography is a painless test that uses sound waves to create images of your heart. It provides your doctor with information about the size and shape of your heart and how well your heart's chambers and valves are working. This procedure takes approximately one hour. There are no restrictions for this procedure. Please do NOT wear cologne, perfume, aftershave, or lotions (deodorant is allowed). Please arrive 15 minutes prior to your appointment time.  Please note: We ask at that you not bring children with you during ultrasound (echo/ vascular) testing. Due to room size and safety concerns, children are not allowed in the ultrasound rooms during exams. Our front office staff cannot provide observation of children in our lobby area while testing is being conducted. An adult accompanying a patient to their appointment will only be allowed in the ultrasound room at the discretion of the ultrasound technician under special circumstances. We apologize for any inconvenience.   Follow-Up: At [facility], you and your health needs are our priority.  As part of our continuing mission to provide you with exceptional heart care, our providers are all part of one team.  This team includes your primary Cardiologist (physician) and Advanced Practice Providers or  APPs (Physician Assistants and Nurse Practitioners) who all work together to provide you with the care you need, when you need it.  Your next appointment:   1 year(s)  Provider:   Dr. Floretta

## 2023-12-11 ENCOUNTER — Ambulatory Visit: Payer: Self-pay | Admitting: Student in an Organized Health Care Education/Training Program

## 2023-12-11 LAB — LIPOPROTEIN A (LPA): Lipoprotein (a): 57.7 nmol/L (ref <75.0)

## 2024-01-10 ENCOUNTER — Ambulatory Visit: Payer: Managed Care, Other (non HMO) | Admitting: Allergy & Immunology

## 2024-01-15 ENCOUNTER — Ambulatory Visit (HOSPITAL_COMMUNITY)
Admission: RE | Admit: 2024-01-15 | Discharge: 2024-01-15 | Disposition: A | Discharge status: Home / Self Care | Source: Ambulatory Visit | Attending: Cardiovascular Disease | Admitting: Cardiovascular Disease

## 2024-01-15 DIAGNOSIS — I341 Nonrheumatic mitral (valve) prolapse: Secondary | ICD-10-CM | POA: Insufficient documentation

## 2024-01-16 LAB — ECHOCARDIOGRAM COMPLETE
Area-P 1/2: 2.85 cm2
S' Lateral: 2.8 cm

## 2024-01-28 ENCOUNTER — Ambulatory Visit (INDEPENDENT_AMBULATORY_CARE_PROVIDER_SITE_OTHER): Admitting: Allergy & Immunology

## 2024-01-28 ENCOUNTER — Encounter: Payer: Self-pay | Admitting: Allergy & Immunology

## 2024-01-28 ENCOUNTER — Other Ambulatory Visit: Payer: Self-pay

## 2024-01-28 VITALS — BP 120/76 | HR 88 | Temp 98.0°F | Resp 18 | Ht 63.0 in | Wt 168.0 lb

## 2024-01-28 DIAGNOSIS — T7800XD Anaphylactic reaction due to unspecified food, subsequent encounter: Secondary | ICD-10-CM

## 2024-01-28 DIAGNOSIS — J302 Other seasonal allergic rhinitis: Secondary | ICD-10-CM

## 2024-01-28 DIAGNOSIS — J3089 Other allergic rhinitis: Secondary | ICD-10-CM | POA: Diagnosis not present

## 2024-01-28 DIAGNOSIS — L232 Allergic contact dermatitis due to cosmetics: Secondary | ICD-10-CM | POA: Diagnosis not present

## 2024-01-28 DIAGNOSIS — J4520 Mild intermittent asthma, uncomplicated: Secondary | ICD-10-CM

## 2024-01-28 MED ORDER — ALBUTEROL SULFATE HFA 108 (90 BASE) MCG/ACT IN AERS: 2.0000 | INHALATION_SPRAY | RESPIRATORY_TRACT | 1 refills | Status: AC | PRN

## 2024-01-28 MED ORDER — AUVI-Q 0.3 MG/0.3ML IJ SOAJ: 0.3000 mg | INTRAMUSCULAR | 1 refills | Status: AC | PRN

## 2024-01-28 MED ORDER — TRIAMCINOLONE ACETONIDE 0.1 % EX OINT: 1.0000 | TOPICAL_OINTMENT | Freq: Two times a day (BID) | CUTANEOUS | 0 refills | Status: AC

## 2024-01-28 NOTE — Patient Instructions (Addendum)
 1. Seasonal and perennial allergic rhinitis (trees, weeds, grasses, dust mites and cockroach) - Continue with: Zyrtec  (cetirizine ) 10mg  tablet once daily  - Consider allergy  shots as a means of long-term control.   2. Mild persistent asthma, uncomplicated - Lung function looked stable today.  - Daily controller medication(s): NOTHING - Prior to physical activity: albuterol  2 puffs 10-15 minutes before physical activity (if this is a trigger) - Rescue medications: albuterol  4 puffs every 4-6 hours as needed or albuterol  nebulizer one vial every 4-6 hours as needed - Changes during respiratory infections or worsening symptoms: Add on Qvar  to 2 puffs three times daily for ONE TO TWO WEEKS. - Asthma control goals:  * Full participation in all desired activities (may need albuterol  before activity) * Albuterol  use two time or less a week on average (not counting use with activity) * Cough interfering with sleep two time or less a month * Oral steroids no more than once a year * No hospitalizations   3. Allergic contact dermatitis - controlled - It looks like you have your cosmetics all figured out. - Continue to use these products and avoid your other triggers.  - Continue with the See Me product daily for your face.   4. Rash under breasts - Comparison with the yeast infections, does not appear like that. - Start triamcinolone  twice daily for 1-2 weeks to see if this can clear it up. - Please CALL OR MYCHART with an update in one week.  4. 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 - Epinephrine  is up to date.  - Labs ordered to look for these food triggers.   5. Return in about 1 year (around 01/27/2025). You can have the follow up appointment with Dr. Iva or a Nurse Practicioner (our Nurse Practitioners are excellent and always have Physician oversight!).    Please inform us  of any Emergency Department visits, hospitalizations, or  changes in symptoms. Call us  before going to the ED for breathing or allergy  symptoms since we might be able to fit you in for a sick visit. Feel free to contact us  anytime with any questions, problems, or concerns.  It was a pleasure to see you again today!  Websites that have reliable patient information: 1. American Academy of Asthma, Allergy , and Immunology: www.aaaai.org 2. Food Allergy  Research and Education (FARE): foodallergy.org 3. Mothers of Asthmatics: http://www.asthmacommunitynetwork.org 4. American College of Allergy , Asthma, and Immunology: www.acaai.org      Like us  on [employer] and Instagram for our latest updates!      A healthy democracy works best when [employer] participate! Make sure you are registered to vote! If you have moved or changed any of your contact information, you will need to get this updated before voting! Scan the QR codes below to learn more!

## 2024-01-28 NOTE — Progress Notes (Addendum)
 "  FOLLOW UP  Date of Service/Encounter:  01/28/2024   Assessment:   Mild persistent asthma, uncomplicated   Seasonal and perennial allergic rhinitis (trees, weeds, grasses, dust mites and cockroach) - prefers to treat with peppermint tea  Preference to avoid nasal sprays including Netti pot   Allergic contact dermatitis (parabens, fragrance mix, tea tree)   Adverse food reaction (orange, kiwi), now tolerates egg and soy   Poor compliance - secondary to preference for holistic therapies   [employer] fanatic  Recently elevated ANA and elevated GFR - sees Nephrology and referred to Rheumatology next (do not have outside labs, but we have requested those)  Plan/Recommendations:   1. Seasonal and perennial allergic rhinitis (trees, weeds, grasses, dust mites and cockroach) - Continue with: Zyrtec  (cetirizine ) 10mg  tablet once daily  - Consider allergy  shots as a means of long-term control.   2. Mild persistent asthma, uncomplicated - Lung function looked stable today.  - Daily controller medication(s): NOTHING - Prior to physical activity: albuterol  2 puffs 10-15 minutes before physical activity (if this is a trigger) - Rescue medications: albuterol  4 puffs every 4-6 hours as needed or albuterol  nebulizer one vial every 4-6 hours as needed - Changes during respiratory infections or worsening symptoms: Add on Qvar  to 2 puffs three times daily for ONE TO TWO WEEKS. - Asthma control goals:  * Full participation in all desired activities (may need albuterol  before activity) * Albuterol  use two time or less a week on average (not counting use with activity) * Cough interfering with sleep two time or less a month * Oral steroids no more than once a year * No hospitalizations   3. Allergic contact dermatitis - controlled - It looks like you have your cosmetics all figured out. - Continue to use these products and avoid your other triggers.  - Continue with the See Me product daily  for your face.   4. Rash under breasts - Comparison with the yeast infections, does not appear like that. - Start triamcinolone  twice daily for 1-2 weeks to see if this can clear it up. - Please CALL OR MYCHART with an update in one week.  4. 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 - Epinephrine  is up to date.  - Labs ordered to look for these food triggers.   5. Return in about 1 year (around 01/27/2025). You can have the follow up appointment with Dr. Iva or a Nurse Practicioner (our Nurse Practitioners are excellent and always have Physician oversight!).   Subjective:   Hailey Lam is a 54 y.o. female presenting today for follow up of  Chief Complaint  Patient presents with   Follow-up    No complaints but have been having a raw itching feeling under both breast.     Hailey Lam has a history of the following: Patient Active Problem List   Diagnosis Date Noted   Mild intermittent asthma without complication 07/10/2021   Anaphylaxis due to food, subsequent encounter 07/10/2021   Bloating 02/04/2020   IBS (irritable bowel syndrome) 02/04/2020   Abdominal pain 02/04/2020   Multinodular goiter 06/20/2018   Seasonal and perennial allergic rhinitis 04/25/2017   Mild persistent asthma without complication 04/25/2017   Allergic contact dermatitis due to cosmetics 04/25/2017   Adverse food reaction 04/25/2017   Encounter for routine gynecological examination 08/19/2013   Edema 04/14/2013   Neuropathy 04/14/2013   Atypical chest pain 04/14/2013    History obtained  from: chart review and patient.  Discussed the use of AI scribe software for clinical note transcription with the patient and/or guardian, who gave verbal consent to proceed.  Hailey Lam is a 54 y.o. female presenting for a follow up visit.  She was last seen in December 2024.  At that time, she was continued on Zyrtec .  For lung function, she was doing  very good.  We stopped montelukast  and continue with albuterol  as needed.  She has Qvar  that she has during flares.  Her contact dermatitis was under good control with her specific cosmetics.  She continue to avoid kiwi, egg, soy, cinnamon, and orange.  Since last visit, she has done well.  She has been under the care of a kidney specialist since August 2025 after a decline in kidney function was noted. 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 Her kidney function had decreased to 56 but has since improved to 66.  She is going to send us  the most recent [facility] labs.   She is referred to Rheumatology (appt with Dr. Luba is pending for February 6th).   Asthma/Respiratory Symptom History: She used her rescue inhaler in November 2025 due to breathing difficulties when the weather changed.  Allergic Rhinitis Symptom History: She does not use salt water  rinses but uses a Neti pot when necessary. She last used her rescue inhaler in November 2025. She experiences sinus issues, particularly during this time of year, and manages symptoms with hot teas, such as peppermint and chamomile, rather than medications.   Food Allergy  Symptom History: She has a history of food allergies, including kiwi, oranges, and muscadine grapes, which cause throat itching. She has been able to tolerate eggs and soy sauce without issues. She continues to avoid known allergens. She thinks that her EpiPen  is not current, so we are going to send another one into the pharmacy.   Skin Symptom History: She developed a rash under her breast in November 2025, described as initially red and scaly, which has since turned dark brown but remains itchy. She has tried hydrocortisone cream, Vagisil, and Benadryl for relief, with only temporary success. The rash's appearance has improved, but the itching persists.  We reviewed pictures of  candidal breast infections, but she reports that her breast does not look like that.  She reports more erythema and peeling skin.  It has gotten better and is warm throughout, but the itching persists.  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  Her husband is working at a factory that toys[employer].  He does not seem excited about the job.  We did get the outside medical records.  She had a metabolic panel which showed a GFR of 56 with a creatinine level of 1.17.  Her ANA was 1:80. She had a urinalysis that had moderate bacteria as well as WBC esterase.   Otherwise, there have been no changes to her past medical history, surgical history, family history, or social history.    Review of systems otherwise negative other than that mentioned in the HPI.    Objective:   Blood pressure 120/76, pulse 88, temperature 98 F (36.7 C), temperature source Temporal, resp. rate 18, height 5' 3 (1.6 m), weight 168 lb (76.2 kg), last menstrual period 04/18/2017, SpO2 98%. Body mass index is 29.76 kg/m.    Physical Exam Vitals reviewed.  Constitutional:  Appearance: Normal appearance. She is well-developed.     Comments: Very lovely. Smiling. Talkative.   HENT:     Head: Normocephalic and atraumatic.     Right Ear: Tympanic membrane, ear canal and external ear normal.     Left Ear: Tympanic membrane, ear canal and external ear normal.     Nose: No nasal deformity, septal deviation, mucosal edema or rhinorrhea.     Right Turbinates: Enlarged, swollen and pale.     Left Turbinates: Enlarged, swollen and pale.     Right Sinus: No maxillary sinus tenderness or frontal sinus tenderness.     Left Sinus: No maxillary sinus tenderness or frontal sinus tenderness.     Comments: No polyps.    Mouth/Throat:     Mouth: Mucous membranes are not pale and not dry.     Pharynx: Uvula midline.  Eyes:     General: Lids are normal. No allergic shiner.       Right eye:  No discharge.        Left eye: No discharge.     Conjunctiva/sclera: Conjunctivae normal.     Right eye: Right conjunctiva is not injected. No chemosis.    Left eye: Left conjunctiva is not injected. No chemosis.    Pupils: Pupils are equal, round, and reactive to light.  Cardiovascular:     Rate and Rhythm: Normal rate and regular rhythm.     Heart sounds: Normal heart sounds.  Pulmonary:     Effort: Pulmonary effort is normal. No tachypnea, accessory muscle usage or respiratory distress.     Breath sounds: Normal breath sounds. No wheezing, rhonchi or rales.     Comments: Moving air well in all lung fields.  Chest:     Chest wall: No tenderness.  Lymphadenopathy:     Cervical: No cervical adenopathy.  Skin:    General: Skin is warm.     Capillary Refill: Capillary refill takes less than 2 seconds.     Coloration: Skin is not pale.     Findings: No abrasion, erythema, petechiae or rash. Rash is not papular, urticarial or vesicular.     Comments: Skin looks fantastic.  I did not evaluate the rash around her breasts.  Neurological:     Mental Status: She is alert.  Psychiatric:        Behavior: Behavior is cooperative.      Diagnostic studies:    Spirometry: results normal (FEV1: 2.01/89%, FVC: 2.78/99%, FEV1/FVC: 72%).    Spirometry consistent with normal pattern.   Allergy  Studies: none       Hailey Shaggy, MD  [facility]         "

## 2024-01-30 LAB — ALLERGEN, ORANGE F33: Orange: 1.25 kU/L — AB

## 2024-01-30 LAB — ALLERGEN, KIWI FRUIT, F84: Kiwi Fruit: 0.41 kU/L — AB

## 2024-02-11 ENCOUNTER — Encounter: Payer: Self-pay | Admitting: Allergy & Immunology

## 2024-02-11 ENCOUNTER — Ambulatory Visit: Payer: Self-pay | Admitting: Allergy & Immunology

## 2024-02-14 NOTE — Progress Notes (Unsigned)
 "  Office Visit Note  Patient: Hailey Lam             Date of Birth: [birth year]           MRN: [id number]             PCP: Detra Lang BROCKS, FNP Referring: Quin Reusing, NP Visit Date: 02/28/2024 Occupation: Data Unavailable  Subjective:  No chief complaint on file.   History of Present Illness: Hailey Lam is a 54 y.o. female ***     Activities of Daily Living:  Patient reports morning stiffness for 15-20 minutes.   Patient Reports nocturnal pain.  Difficulty dressing/grooming: Reports Difficulty climbing stairs: Denies Difficulty getting out of chair: Reports Difficulty using hands for taps, buttons, cutlery, and/or writing: Reports  Review of Systems  Constitutional:  Positive for fatigue.  HENT:  Positive for mouth dryness. Negative for mouth sores.   Eyes:  Positive for dryness.  Respiratory:  Negative for shortness of breath.   Cardiovascular:  Positive for chest pain and palpitations.  Gastrointestinal:  Negative for blood in stool, constipation and diarrhea.  Endocrine: Negative for increased urination.  Genitourinary:  Negative for involuntary urination.  Musculoskeletal:  Positive for joint pain, gait problem, joint pain, joint swelling, myalgias, morning stiffness, muscle tenderness and myalgias. Negative for muscle weakness.  Skin:  Positive for sensitivity to sunlight. Negative for color change, rash and hair loss.  Allergic/Immunologic: Negative for susceptible to infections.  Neurological:  Positive for dizziness and headaches.  Hematological:  Negative for swollen glands.  Psychiatric/Behavioral:  Positive for sleep disturbance. Negative for depressed mood. The patient is not nervous/anxious.     PMFS History:  Patient Active Problem List   Diagnosis Date Noted   Mild intermittent asthma without complication 07/10/2021   Anaphylaxis due to food, subsequent encounter 07/10/2021   Bloating 02/04/2020   IBS (irritable bowel syndrome) 02/04/2020    Abdominal pain 02/04/2020   Multinodular goiter 06/20/2018   Seasonal and perennial allergic rhinitis 04/25/2017   Mild persistent asthma without complication 04/25/2017   Allergic contact dermatitis due to cosmetics 04/25/2017   Adverse food reaction 04/25/2017   Encounter for routine gynecological examination 08/19/2013   Edema 04/14/2013   Neuropathy 04/14/2013   Atypical chest pain 04/14/2013    Past Medical History:  Diagnosis Date   Angio-edema    Asthma    Chest pain    Complication of anesthesia    PONV   Dizziness    Family history of diabetes mellitus    Family history of hypertension    Heart murmur    History of palpitations    Mitral valve regurgitation    Recurrent upper respiratory infection (URI)    Seasonal allergies     Family History  Problem Relation Age of Onset   Pneumonia Maternal Grandmother    Asthma Mother    Diabetes Mother    Hypertension Mother    Allergic rhinitis Mother        Allergic to soy milk   Diabetes Father    Hypertension Father    Eczema Son    Psoriasis Son    Heart disease Maternal Uncle    Heart attack Maternal Uncle        3 heart attacks   Eczema Son    Asthma Son    Eczema Son    Past Surgical History:  Procedure Laterality Date   Abdominal Ablation  05/1999   ABDOMINAL HYSTERECTOMY  07/01/2017   COLONOSCOPY WITH PROPOFOL   N/A 03/11/2020   Procedure: COLONOSCOPY WITH PROPOFOL ;  Surgeon: Eartha Angelia Sieving, MD;  Location: [facility];  Service: Gastroenterology;  Laterality: N/A;  1:00   DOBUTAMINE  STRESS ECHO  05/31/2009   exercised to stage 3 of bruce protocol - normal BP response, no ischemia by EKG & echo criteria   POLYPECTOMY  03/11/2020   Procedure: POLYPECTOMY INTESTINAL;  Surgeon: Eartha Angelia, Sieving, MD;  Location: [facility];  Service: Gastroenterology;;  transverse colon polyp;    TRANSTHORACIC ECHOCARDIOGRAM  07/2011   EF=>55%, LV hyperdynamic; MV leaflets appear thickened & mild MR; mild  TR   TUBAL LIGATION  10/2005   WISDOM TOOTH EXTRACTION     Social History[1] Social History   Social History Narrative   Not on file     Immunization History  Administered Date(s) Administered   Moderna SARS-COV2 Booster Vaccination 12/11/2019   Moderna Sars-Covid-2 Vaccination 04/17/2019, 05/15/2019     Objective: Vital Signs: LMP 04/18/2017    Physical Exam   Musculoskeletal Exam: ***  CDAI Exam: CDAI Score: -- Patient Global: --; Provider Global: -- Swollen: --; Tender: -- Joint Exam 02/28/2024   No joint exam has been documented for this visit   There is currently no information documented on the homunculus. Go to the Rheumatology activity and complete the homunculus joint exam.  Investigation: No additional findings.  Imaging: No results found.  Recent Labs: No results found for: WBC, HGB, PLT, NA, K, CL, CO2, GLUCOSE, BUN, CREATININE, BILITOT, ALKPHOS, AST, ALT, PROT, ALBUMIN, CALCIUM, GFRAA, QFTBGOLD, QFTBGOLDPLUS  Speciality Comments: No specialty comments available.  Procedures:  No procedures performed Allergies: Minocycline, Tea tree oil, Penicillins, Orange oil, Kiwi extract, Other, and Sulfa antibiotics   Assessment / Plan:     Visit Diagnoses: No diagnosis found.  Orders: No orders of the defined types were placed in this encounter.  No orders of the defined types were placed in this encounter.   Face-to-face time spent with patient was *** minutes. Greater than 50% of time was spent in counseling and coordination of care.  Follow-Up Instructions: No follow-ups on file.   Alfonso Patterson, LPN  Note - This record has been created using [employer].  Chart creation errors have been sought, but may not always  have been located. Such creation errors do not reflect on  the standard of medical care.     [1]  Social History Tobacco Use   Smoking status: Never   Smokeless tobacco: Never   Vaping Use   Vaping status: Never Used  Substance Use Topics   Alcohol use: No   Drug use: No   "

## 2024-02-28 ENCOUNTER — Other Ambulatory Visit: Payer: Self-pay | Admitting: Medical Genetics

## 2024-02-28 ENCOUNTER — Ambulatory Visit: Payer: Self-pay

## 2024-02-28 ENCOUNTER — Ambulatory Visit (HOSPITAL_BASED_OUTPATIENT_CLINIC_OR_DEPARTMENT_OTHER): Admission: RE | Admit: 2024-02-28 | Source: Ambulatory Visit | Admitting: Radiology

## 2024-02-28 VITALS — BP 101/71 | HR 78 | Temp 97.8°F | Resp 12 | Ht 63.0 in | Wt 168.2 lb

## 2024-02-28 DIAGNOSIS — M25511 Pain in right shoulder: Secondary | ICD-10-CM

## 2024-02-28 DIAGNOSIS — M545 Low back pain, unspecified: Secondary | ICD-10-CM

## 2024-02-28 DIAGNOSIS — M2550 Pain in unspecified joint: Secondary | ICD-10-CM

## 2024-02-28 DIAGNOSIS — J329 Chronic sinusitis, unspecified: Secondary | ICD-10-CM

## 2024-02-28 DIAGNOSIS — R7689 Other specified abnormal immunological findings in serum: Secondary | ICD-10-CM

## 2024-02-28 DIAGNOSIS — N189 Chronic kidney disease, unspecified: Secondary | ICD-10-CM

## 2024-03-04 ENCOUNTER — Other Ambulatory Visit (HOSPITAL_COMMUNITY)

## 2024-04-01 ENCOUNTER — Ambulatory Visit

## 2025-01-27 ENCOUNTER — Ambulatory Visit: Admitting: Allergy & Immunology
